# Patient Record
Sex: Female | Born: 1990
Health system: Southern US, Community
[De-identification: ages and names within clinical notes are randomized; demographics above are authoritative.]

## PROBLEM LIST (undated history)

## (undated) DIAGNOSIS — N760 Acute vaginitis: Secondary | ICD-10-CM

## (undated) DIAGNOSIS — Z789 Other specified health status: Secondary | ICD-10-CM

## (undated) DIAGNOSIS — B9689 Other specified bacterial agents as the cause of diseases classified elsewhere: Secondary | ICD-10-CM

## (undated) DIAGNOSIS — N898 Other specified noninflammatory disorders of vagina: Principal | ICD-10-CM

## (undated) HISTORY — PX: NO PAST SURGERIES: SHX2092

## (undated) HISTORY — DX: Other specified bacterial agents as the cause of diseases classified elsewhere: B96.89

## (undated) HISTORY — DX: Acute vaginitis: N76.0

## (undated) HISTORY — DX: Other specified health status: Z78.9

## (undated) HISTORY — DX: Other specified noninflammatory disorders of vagina: N89.8

---
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
## 2009-12-03 ENCOUNTER — Emergency Department (HOSPITAL_COMMUNITY): Admission: EM | Admit: 2009-12-03 | Discharge: 2009-12-03 | Payer: Self-pay | Admitting: Emergency Medicine

## 2011-07-28 ENCOUNTER — Encounter (HOSPITAL_COMMUNITY): Payer: Self-pay | Admitting: Emergency Medicine

## 2011-07-28 ENCOUNTER — Emergency Department (HOSPITAL_COMMUNITY)
Admission: EM | Admit: 2011-07-28 | Discharge: 2011-07-28 | Disposition: A | Discharge status: Home / Self Care | Payer: Self-pay | Attending: Emergency Medicine | Admitting: Emergency Medicine

## 2011-07-28 DIAGNOSIS — Z87891 Personal history of nicotine dependence: Secondary | ICD-10-CM | POA: Insufficient documentation

## 2011-07-28 DIAGNOSIS — B360 Pityriasis versicolor: Secondary | ICD-10-CM | POA: Insufficient documentation

## 2011-07-28 NOTE — ED Notes (Signed)
Pt c/o rash on R leg.

## 2011-07-28 NOTE — Discharge Instructions (Signed)
Tinea Versicolor Tinea versicolor is a common yeast infection of the skin. This condition becomes known when the yeast on our skin starts to overgrow (yeast is a normal inhabitant on our skin). This condition is noticed as white or light brown patches on brown skin, and is more evident in the summer on tanned skin. These areas are slightly scaly if scratched. The light patches from the yeast become evident when the yeast creates "holes in your suntan". This is most often noticed in the summer. The patches are usually located on the chest, back, pubis, neck and body folds. However, it may occur on any area of body. Mild itching and inflammation (redness or soreness) may be present. DIAGNOSIS  The diagnosisof this is made clinically (by looking). Cultures from samples are usually not needed. Examination under the microscope may help. However, yeast is normally found on skin. The diagnosis still remains clinical. Examination under Wood's Ultraviolet Light can determine the extent of the infection. TREATMENT  This common infection is usually only of cosmetic (only a concern to your appearance). It is easily treated with dandruff shampoo used during showers or bathing. Vigorous scrubbing will eliminate the yeast over several days time. The light areas in your skin may remain for weeks or months after the infection is cured unless your skin is exposed to sunlight. The lighter or darker spots caused by the fungus that remain after complete treatment are not a sign of treatment failure; it will take a long time to resolve. Your caregiver may recommend a number of commercial preparations or medication by mouth if home care is not working. Recurrence is common and preventative medication may be necessary. This skin condition is not highly contagious. Special care is not needed to protect close friends and family members. Normal hygiene is usually enough. Follow up is required only if you develop complications (such as a  secondary infection from scratching), if recommended by your caregiver, or if no relief is obtained from the preparations used. Document Released: 05/01/2000 Document Revised: 04/23/2011 Document Reviewed: 06/13/2008 [facility] Patient Information 2012 [place], [place].   Use selsun blue or head and shoulders shampoo as discussed above.

## 2011-07-28 NOTE — ED Provider Notes (Signed)
History     CSN: [id number]  Arrival date & time 07/28/11  1305   First MD Initiated Contact with Patient 07/28/11 1327      Chief Complaint  Patient presents with  . Rash    (Consider location/radiation/quality/duration/timing/severity/associated sxs/prior treatment) Patient is a 21 y.o. female presenting with rash. The history is provided by the patient.  Rash  This is a new problem. Episode onset: First noticed rash about 2 weeks ago. The problem has not changed since onset.The problem is associated with nothing. There has been no fever. Affected Location: Isolated to upper and lower medial legs. The pain is at a severity of 0/10. The patient is experiencing no pain. Pertinent negatives include no blisters, no itching, no pain and no weeping. She has tried nothing for the symptoms. The treatment provided no relief. Risk factors: She denies any change in environment such as new job or home, no new medications.  She is using a  lotion on her legs which is not new or different.    History reviewed. No pertinent past medical history.  History reviewed. No pertinent past surgical history.  History reviewed. No pertinent family history.  History  Substance Use Topics  . Smoking status: Former Smoker    Types: Cigarettes  . Smokeless tobacco: Never Used  . Alcohol Use: No    OB History    Grav Para Term Preterm Abortions TAB SAB Ect Mult Living   1    1 1           Review of Systems  Constitutional: Negative for fever.  HENT: Negative for congestion, sore throat and neck pain.   Eyes: Negative.   Respiratory: Negative for chest tightness and shortness of breath.   Cardiovascular: Negative for chest pain.  Gastrointestinal: Negative for nausea and abdominal pain.  Genitourinary: Negative.   Musculoskeletal: Negative for joint swelling and arthralgias.  Skin: Positive for rash. Negative for itching and wound.  Neurological: Negative for dizziness, weakness,  light-headedness, numbness and headaches.  Hematological: Negative.   Psychiatric/Behavioral: Negative.     Allergies  Review of patient's allergies indicates no known allergies.  Home Medications  No current outpatient prescriptions on file.  BP 178/0  Pulse 82  Temp(Src) 98.6 F (37 C) (Oral)  Resp 18  SpO2 100%  LMP 07/16/2011  Physical Exam  Nursing note and vitals reviewed. Constitutional: She is oriented to person, place, and time. She appears well-developed and well-nourished.  HENT:  Head: Normocephalic and atraumatic.  Eyes: Conjunctivae are normal.  Neck: Normal range of motion.  Cardiovascular: Normal rate, regular rhythm and intact distal pulses.   Pulmonary/Chest: Effort normal and breath sounds normal. She has no wheezes.  Abdominal: She exhibits no distension.  Musculoskeletal: Normal range of motion.  Neurological: She is alert and oriented to person, place, and time.  Skin: Skin is warm and dry. Rash noted. No erythema.       Macular-appearing lacy discoloration on bilateral medial legs.  No excoriations, papules, erythema or induration.  The lacy configuration is lighter than her normal skin tone.  Psychiatric: She has a normal mood and affect.    ED Course  Procedures (including critical care time)  Labs Reviewed - No data to display No results found.   1. Tinea versicolor       MDM  Encouraged a trial of selenium sulfite such as head and shoulders or Selsun Blue shampoo applied to skin twice daily and washed.  Referral to Dr. Margo Aye for  recheck if not improving over the next 2 weeks.       Candis Musa, PA 07/28/11 1610

## 2011-07-28 NOTE — ED Provider Notes (Signed)
Medical screening examination/treatment/procedure(s) were performed by non-physician practitioner and as supervising physician I was immediately available for consultation/collaboration.  Breyon Blass, MD 07/28/11 1811 

## 2011-10-18 ENCOUNTER — Encounter (HOSPITAL_COMMUNITY): Payer: Self-pay | Admitting: *Deleted

## 2011-10-18 ENCOUNTER — Emergency Department (HOSPITAL_COMMUNITY)
Admission: EM | Admit: 2011-10-18 | Discharge: 2011-10-18 | Disposition: A | Discharge status: Home / Self Care | Payer: Self-pay | Attending: Emergency Medicine | Admitting: Emergency Medicine

## 2011-10-18 DIAGNOSIS — N949 Unspecified condition associated with female genital organs and menstrual cycle: Secondary | ICD-10-CM | POA: Insufficient documentation

## 2011-10-18 DIAGNOSIS — A499 Bacterial infection, unspecified: Secondary | ICD-10-CM | POA: Insufficient documentation

## 2011-10-18 DIAGNOSIS — B9689 Other specified bacterial agents as the cause of diseases classified elsewhere: Secondary | ICD-10-CM | POA: Insufficient documentation

## 2011-10-18 DIAGNOSIS — R102 Pelvic and perineal pain: Secondary | ICD-10-CM

## 2011-10-18 DIAGNOSIS — N760 Acute vaginitis: Secondary | ICD-10-CM | POA: Insufficient documentation

## 2011-10-18 LAB — URINALYSIS, ROUTINE W REFLEX MICROSCOPIC
Glucose, UA: NEGATIVE mg/dL
Leukocytes, UA: NEGATIVE
Protein, ur: NEGATIVE mg/dL
pH: 6 (ref 5.0–8.0)

## 2011-10-18 LAB — URINE MICROSCOPIC-ADD ON

## 2011-10-18 LAB — WET PREP, GENITAL
Trich, Wet Prep: NONE SEEN
Yeast Wet Prep HPF POC: NONE SEEN

## 2011-10-18 MED ORDER — OXYCODONE-ACETAMINOPHEN 5-325 MG PO TABS: 1.0000 | ORAL_TABLET | Freq: Four times a day (QID) | ORAL | Status: AC | PRN

## 2011-10-18 MED ORDER — METRONIDAZOLE 500 MG PO TABS: 500.0000 mg | ORAL_TABLET | Freq: Two times a day (BID) | ORAL | Status: AC

## 2011-10-18 NOTE — ED Notes (Signed)
Burning with urination that started today, associated with generalized weakness, vaginal pain.

## 2011-10-18 NOTE — ED Notes (Signed)
Discharge instructions reviewed with pt, questions answered. Pt verbalized understanding.  

## 2011-10-18 NOTE — ED Notes (Signed)
MD at bedside. 

## 2011-10-18 NOTE — Discharge Instructions (Signed)
Bacterial Vaginosis Bacterial vaginosis (BV) is a vaginal infection where the normal balance of bacteria in the vagina is disrupted. The normal balance is then replaced by an overgrowth of certain bacteria. There are several different kinds of bacteria that can cause BV. BV is the most common vaginal infection in women of childbearing age. CAUSES   The cause of BV is not fully understood. BV develops when there is an increase or imbalance of harmful bacteria.   Some activities or behaviors can upset the normal balance of bacteria in the vagina and put women at increased risk including:   Having a new sex partner or multiple sex partners.   Douching.   Using an intrauterine device (IUD) for contraception.   It is not clear what role sexual activity plays in the development of BV. However, women that have never had sexual intercourse are rarely infected with BV.  Women do not get BV from toilet seats, bedding, swimming pools or from touching objects around them.  SYMPTOMS   Grey vaginal discharge.   A fish-like odor with discharge, especially after sexual intercourse.   Itching or burning of the vagina and vulva.   Burning or pain with urination.   Some women have no signs or symptoms at all.  DIAGNOSIS  Your caregiver must examine the vagina for signs of BV. Your caregiver will perform lab tests and look at the sample of vaginal fluid through a microscope. They will look for bacteria and abnormal cells (clue cells), a pH test higher than 4.5, and a positive amine test all associated with BV.  RISKS AND COMPLICATIONS   Pelvic inflammatory disease (PID).   Infections following gynecology surgery.   Developing HIV.   Developing herpes virus.  TREATMENT  Sometimes BV will clear up without treatment. However, all women with symptoms of BV should be treated to avoid complications, especially if gynecology surgery is planned. Female partners generally do not need to be treated. However,  BV may spread between female sex partners so treatment is helpful in preventing a recurrence of BV.   BV may be treated with antibiotics. The antibiotics come in either pill or vaginal cream forms. Either can be used with nonpregnant or pregnant women, but the recommended dosages differ. These antibiotics are not harmful to the baby.   BV can recur after treatment. If this happens, a second round of antibiotics will often be prescribed.   Treatment is important for pregnant women. If not treated, BV can cause a premature delivery, especially for a pregnant woman who had a premature birth in the past. All pregnant women who have symptoms of BV should be checked and treated.   For chronic reoccurrence of BV, treatment with a type of prescribed gel vaginally twice a week is helpful.  HOME CARE INSTRUCTIONS   Finish all medication as directed by your caregiver.   Do not have sex until treatment is completed.   Tell your sexual partner that you have a vaginal infection. They should see their caregiver and be treated if they have problems, such as a mild rash or itching.   Practice safe sex. Use condoms. Only have 1 sex partner.  PREVENTION  Basic prevention steps can help reduce the risk of upsetting the natural balance of bacteria in the vagina and developing BV:  Do not have sexual intercourse (be abstinent).   Do not douche.   Use all of the medicine prescribed for treatment of BV, even if the signs and symptoms go away.     Tell your sex partner if you have BV. That way, they can be treated, if needed, to prevent reoccurrence.  SEEK MEDICAL CARE IF:   Your symptoms are not improving after 3 days of treatment.   You have increased discharge, pain, or fever.  MAKE SURE YOU:   Understand these instructions.   Will watch your condition.   Will get help right away if you are not doing well or get worse.  FOR MORE INFORMATION  Division of STD Prevention (DSTDP), Centers for Disease  Control and Prevention: [employer]Apps.[employer] American Social Health Association (ASHA): www.ashastd.org  Document Released: 05/04/2005 Document Revised: 04/23/2011 Document Reviewed: 10/25/2008 [facility] Patient Information 2012 [place], [place].Disorders of the Vulva It is important to know the outside (external) area of the female genitalia to properly examine yourself. The vulva or labia, sometimes also called the lips around the vagina, covers the pubic bone and surrounds the vagina. The larger or outer labia is called the labia majora. The inner or smaller labia is called the labia minora. The clitoris is at the top of the vulva. It is covered by a small area of tissue called the hood. Below the clitoris is the opening of the tube from the bladder, which is the urethral opening. Just below the urethral opening is the opening of the vagina. This area is called the vestibule. Inside the vestibule are bartholin and skene glands that lubricate the outside of the vagina during sexual intercourse. The perineum is the area that lies between the vagina and anus. You should examine your external genitalia once a month just as you do your self breast examination. SIGNS AND SYMPTOMS TO LOOK FOR DURING YOUR EXAMINATION:  Swelling.   Redness.   Bumps.   Blisters.   Black or white areas.   Itching.   Bleeding.   Burning.  TYPES OF VULVAR PROBLEMS  Infections.   Yeast (fungus) is the most common infection that causes redness, swelling, itching and a thick white vaginal discharge. Women with diabetes, taking medicines that kill germs (antibiotics) or on birth control pills are at risk for yeast infection. This infection is treated with vaginal creams, suppositories and anti-itch cream for the vulva.   Genital warts (condyloma) are caused by the human papilloma virus. They are raised bumps that can itch, bleed, cause discomfort and sometimes appear in groups like cauliflower. This is a sexually transmitted  disease (STD) caused by sexual contact. This can be treated with topical medication, freezing, electrocautery, laser or surgical removal.   Genital herpes is a virus that causes redness, swelling, blisters and ulcers that can cause itching and are very painful. This is also a STD. There are medications to control the symptoms of herpes, but there is no cure. Once you have it, you keep getting it because the virus stays in your body.   Contact dermatitis. Contact dermatitis is an irritation of the vulva that can cause redness, swelling and itching. It can be caused by:   Perfumed toilet paper.   Deodorants.   Talcum powder.   Hygiene sprays.   Tampons.   Soaps and fabric softeners.   Wearing wet underwear and bathing suits too long.   Spermicide.   Condoms.   Diaphragms.   Poison ivy.  Treatment will depend on eliminating the cause and treating the symptoms.  Vulvodynia.   Vulvodynia is "painful vulva." It includes burning, itching, irritation and a feeling of rawness or bruising of the vulva. Treating this problem depends on the cause and diagnosis.  Treatment can take a long time.   Vulvar Dystrophy.   Vulvar Dystrophy is a disorder of the skin of the vulva. The skin can be too thick with hard raised patches or too thin skin showing wrinkles. Vulvar dystrophy can cause redness or white patches, itching and burning sensation. A biopsy may be needed to diagnose the problem. Treatment with creams or ointments depends on the diagnosis.   Vulvar Cancer.   Vulvar cancer is usually a form of squamus skin cancer. Other types of vulvar cancer like melenoma (a dark or black, irregular shaped mole that can bleed easily) and adenocarcinoma (red, itchy, scaly area that looks like eczema) are rare. Treatment is usually surgery to remove the cancerous area, the vulva and the lymph nodes in the groin. This can be done with or without radiation and chemotherapy.  HOME CARE INSTRUCTIONS    Look at your vulva and external genital area every month.   Follow and finish all treatment given to you by your caregiver.   Do not use perfumed or scented soaps, detergents, hygiene spray, talcum powder, tampons, douches or toilet paper.   Remove your tampon every 6 hours. Do not leave tampons in overnight.   Wear loose-fitting clothing.   Wear underwear that is cotton.   Avoid spermicidal creams or foams that irritate you.  SEEK MEDICAL CARE IF:   You notice changes on your vulva such as redness, swelling, itching, color changes, bleeding, small bumps, blisters or any discomfort.   You develop an abnormal vaginal discharge.   You have painful sexual intercourse.   You notice swelling of the lymph nodes in your groin.  Document Released: 10/21/2007 Document Revised: 04/23/2011 Document Reviewed: 08/04/2010  [facility] Patient Information 2012 [place], [place].

## 2011-10-18 NOTE — ED Notes (Signed)
Upon in and out catheterization for urine pt stated the pain was worse when cleaning than during the insertion. Pt complains of a "scratch" on the inside of the vagina which causes burning on urination. Urine collected and sent to lab for analysis.

## 2011-10-18 NOTE — ED Provider Notes (Signed)
History    This chart was scribed for [employer]. Rubin Payor, MD, MD by Smitty Pluck. The patient was seen in room APA03 and the patient's care was started at 3:11PM.   CSN: [id number]  Arrival date & time 10/18/11  1403   First MD Initiated Contact with Patient 10/18/11 1506      Chief Complaint  Patient presents with  . Urinary Tract Infection  . Fatigue    (Consider location/radiation/quality/duration/timing/severity/associated sxs/prior treatment) Patient is a 21 y.o. female presenting with urinary tract infection. The history is provided by the patient.  Urinary Tract Infection Pertinent negatives include no abdominal pain and no shortness of breath.   Sonia Gibson is a 21 y.o. female who presents to the Emergency Department complaining of moderate dysuria onset today. She states that she shaved 3 days ago and since the hair has started growing back she has been experiencing current symptoms. Pt reports that her menstrual cycle is painful. Denies vaginal discharge. She reports that she has vaginal itching. She denies nausea, vomiting, fever and diarrhea. Symptoms have been constant.   History reviewed. No pertinent past medical history.  History reviewed. No pertinent past surgical history.  No family history on file.  History  Substance Use Topics  . Smoking status: Former Smoker    Types: Cigarettes  . Smokeless tobacco: Never Used  . Alcohol Use: No    OB History    Grav Para Term Preterm Abortions TAB SAB Ect Mult Living   1    1 1           Review of Systems  Constitutional: Negative for fever and chills.  Respiratory: Negative for cough and shortness of breath.   Gastrointestinal: Negative for abdominal pain and blood in stool.  Genitourinary: Positive for dysuria and vaginal bleeding. Negative for decreased urine volume and vaginal discharge.  Musculoskeletal: Negative for back pain.  Psychiatric/Behavioral: Negative for agitation.   10 Systems reviewed  and all are negative for acute change except as noted in the HPI.   Allergies  Review of patient's allergies indicates no known allergies.  Home Medications   Current Outpatient Rx  Name Route Sig Dispense Refill  . METRONIDAZOLE 500 MG PO TABS Oral Take 1 tablet (500 mg total) by mouth 2 (two) times daily. 14 tablet 0  . OXYCODONE-ACETAMINOPHEN 5-325 MG PO TABS Oral Take 1-2 tablets by mouth every 6 (six) hours as needed for pain. 10 tablet 0    BP 120/77  Pulse 83  Temp 98.4 F (36.9 C)  Resp 16  SpO2 100%  LMP 10/18/2011  Physical Exam  Nursing note and vitals reviewed. Constitutional: She is oriented to person, place, and time. She appears well-developed and well-nourished. No distress.  HENT:  Head: Normocephalic and atraumatic.  Eyes: EOM are normal. Pupils are equal, round, and reactive to light.  Neck: Normal range of motion. Neck supple. No tracheal deviation present.  Cardiovascular: Normal rate, regular rhythm and normal heart sounds.   Pulmonary/Chest: Effort normal and breath sounds normal. No respiratory distress.  Abdominal: Soft. She exhibits no distension and no mass. There is no tenderness. There is no rebound and no guarding.  Genitourinary:       Patient has vaginal bleeding consistent with her menses. She has mild erythema of her vulva.no Fluctuance. Minimal swelling.no induration. No ulcers. No tearing.  Musculoskeletal: Normal range of motion.  Neurological: She is alert and oriented to person, place, and time.  Skin: Skin is warm and dry.  Psychiatric: She has a normal mood and affect. Her behavior is normal.    ED Course  Procedures (including critical care time) DIAGNOSTIC STUDIES: Oxygen Saturation is 100% on room air, normal by my interpretation.    COORDINATION OF CARE: 3:15PM EDP discusses pt ED treatment with pt.   Labs Reviewed  URINALYSIS, ROUTINE W REFLEX MICROSCOPIC - Abnormal; Notable for the following:    Hgb urine dipstick LARGE  (*)    All other components within normal limits  URINE MICROSCOPIC-ADD ON - Abnormal; Notable for the following:    Squamous Epithelial / LPF FEW (*)    All other components within normal limits  WET PREP, GENITAL - Abnormal; Notable for the following:    Clue Cells Wet Prep HPF POC FEW (*)    All other components within normal limits  PREGNANCY, URINE  GC/CHLAMYDIA PROBE AMP, GENITAL   No results found.   1. Vulvar pain   2. BV (bacterial vaginosis)       MDM  Patient with pain of her vulva. She is very tender but no ulcers or clear signs of infection. She had mild bacterial vaginosis on wet prep. She be treated pain medicines and Flagyl he'll followup with gynecology.herpes was considered, but there were no current lesions.    I personally performed the services described in this documentation, which was scribed in my presence. The recorded information has been reviewed and considered. Juliet Rude. Rubin Payor, MD   Juliet Rude. Rubin Payor, MD 10/18/11 [phone]

## 2011-10-19 LAB — GC/CHLAMYDIA PROBE AMP, GENITAL
Chlamydia, DNA Probe: NEGATIVE
GC Probe Amp, Genital: NEGATIVE

## 2013-03-28 ENCOUNTER — Encounter (HOSPITAL_COMMUNITY): Payer: Self-pay | Admitting: Emergency Medicine

## 2013-03-28 DIAGNOSIS — Z791 Long term (current) use of non-steroidal anti-inflammatories (NSAID): Secondary | ICD-10-CM | POA: Insufficient documentation

## 2013-03-28 DIAGNOSIS — Z87891 Personal history of nicotine dependence: Secondary | ICD-10-CM | POA: Insufficient documentation

## 2013-03-28 DIAGNOSIS — Z79899 Other long term (current) drug therapy: Secondary | ICD-10-CM | POA: Insufficient documentation

## 2013-03-28 DIAGNOSIS — L02219 Cutaneous abscess of trunk, unspecified: Secondary | ICD-10-CM | POA: Insufficient documentation

## 2013-03-28 NOTE — ED Notes (Signed)
Patient complaining of knot/abscess to groin. States noticed knot approximately a week ago, and has not improved.

## 2013-03-29 ENCOUNTER — Emergency Department (HOSPITAL_COMMUNITY)
Admission: EM | Admit: 2013-03-29 | Discharge: 2013-03-29 | Disposition: A | Discharge status: Home / Self Care | Payer: Medicaid Other | Attending: Emergency Medicine | Admitting: Emergency Medicine

## 2013-03-29 DIAGNOSIS — L02214 Cutaneous abscess of groin: Secondary | ICD-10-CM

## 2013-03-29 MED ORDER — SULFAMETHOXAZOLE-TRIMETHOPRIM 800-160 MG PO TABS: 1.0000 | ORAL_TABLET | Freq: Two times a day (BID) | ORAL | Status: DC

## 2013-03-29 MED ORDER — SULFAMETHOXAZOLE-TMP DS 800-160 MG PO TABS
1.0000 | ORAL_TABLET | Freq: Once | ORAL | Status: AC
  Administered 2013-03-29: 1 via ORAL
  Filled 2013-03-29: qty 1

## 2013-03-29 MED ORDER — NAPROXEN 500 MG PO TABS: 500.0000 mg | ORAL_TABLET | Freq: Two times a day (BID) | ORAL | Status: DC

## 2013-03-29 NOTE — ED Provider Notes (Signed)
CSN: [id number]     Arrival date & time 03/28/13  2225 History   First MD Initiated Contact with Patient 03/29/13 0034     Chief Complaint  Patient presents with  . Abscess   (Consider location/radiation/quality/duration/timing/severity/associated sxs/prior Treatment) HPI Comments: 22 year old female, no known medical history who presents with a complaint of right groin pain and swelling. This started several days ago, is gradually worsening, it is associated with a small area of a bump in her right labial fold. She denies fevers, history of diabetes or HIV. Symptoms are persistent  Patient is a 22 y.o. female presenting with abscess. The history is provided by the patient.  Abscess   History reviewed. No pertinent past medical history. History reviewed. No pertinent past surgical history. History reviewed. No pertinent family history. History  Substance Use Topics  . Smoking status: Former Smoker    Types: Cigarettes  . Smokeless tobacco: Never Used  . Alcohol Use: No   OB History   Grav Para Term Preterm Abortions TAB SAB Ect Mult Living   1    1 1          Review of Systems  All other systems reviewed and are negative.    Allergies  Review of patient's allergies indicates no known allergies.  Home Medications   Current Outpatient Rx  Name  Route  Sig  Dispense  Refill  . Multiple Vitamin (MULTIVITAMIN WITH MINERALS) TABS tablet   Oral   Take 1 tablet by mouth daily.         . vitamin E 100 UNIT capsule   Oral   Take by mouth daily.         . naproxen (NAPROSYN) 500 MG tablet   Oral   Take 1 tablet (500 mg total) by mouth 2 (two) times daily with a meal.   30 tablet   0   . sulfamethoxazole-trimethoprim (SEPTRA DS) 800-160 MG per tablet   Oral   Take 1 tablet by mouth every 12 (twelve) hours.   20 tablet   0    BP 131/80  Pulse 75  Temp(Src) 98.1 F (36.7 C) (Oral)  Resp 20  Ht 5\' 4"  (1.626 m)  Wt 109 lb (49.442 kg)  BMI 18.70 kg/m2  SpO2  100%  LMP 03/20/2013 Physical Exam  Nursing note and vitals reviewed. Constitutional: She appears well-developed and well-nourished. No distress.  HENT:  Head: Normocephalic and atraumatic.  Mouth/Throat: Oropharynx is clear and moist. No oropharyngeal exudate.  Eyes: Conjunctivae and EOM are normal. Pupils are equal, round, and reactive to light. Right eye exhibits no discharge. Left eye exhibits no discharge. No scleral icterus.  Neck: Normal range of motion. Neck supple. No JVD present. No thyromegaly present.  Cardiovascular: Normal rate, regular rhythm, normal heart sounds and intact distal pulses.  Exam reveals no gallop and no friction rub.   No murmur heard. Pulmonary/Chest: Effort normal and breath sounds normal. No respiratory distress. She has no wheezes. She has no rales.  Abdominal: Soft. Bowel sounds are normal. She exhibits no distension and no mass. There is no tenderness.  Genitourinary:  Chaperone present for exam, 0.5 cm area of swelling in the right labia, no fluctuants, minimal tenderness, no redness, no lymphadenopathy in the right inguinal region  Musculoskeletal: Normal range of motion. She exhibits no edema and no tenderness.  Lymphadenopathy:    She has no cervical adenopathy.  Neurological: She is alert. Coordination normal.  Skin: Skin is warm and dry. No rash  noted. No erythema.  Psychiatric: She has a normal mood and affect. Her behavior is normal.    ED Course  Procedures (including critical care time) Labs Review Labs Reviewed - No data to display Imaging Review No results found.  EKG Interpretation   None       MDM   1. Abscess of groin    Well appearing, no fever, likely early abscess that is too small for incision and drainage, medications and followup.   Meds given in ED:  Medications  sulfamethoxazole-trimethoprim (BACTRIM DS) 800-160 MG per tablet 1 tablet (not administered)    New Prescriptions   NAPROXEN (NAPROSYN) 500 MG  TABLET    Take 1 tablet (500 mg total) by mouth 2 (two) times daily with a meal.   SULFAMETHOXAZOLE-TRIMETHOPRIM (SEPTRA DS) 800-160 MG PER TABLET    Take 1 tablet by mouth every 12 (twelve) hours.        Vida Roller, MD 03/29/13 [phone]

## 2013-05-18 NOTE — L&D Delivery Note (Signed)
Delivery Note At 11:09 AM a viable female was delivered [facility]  I arrived immediately after birth and delivered placenta and inspected perineum.  APGAR: 9, 9; weight 6 lb 14.9 oz (3145 g).   Placenta status: Intact, Spontaneous.  Cord: 3 vessels with the following complications: None.    Anesthesia: None  Episiotomy: None Lacerations: small hemostatic periurethral, no repair Suture Repair: n/a Est. Blood Loss (mL): 350ml  [facility].  [facility] for skin-to-skin/bonding w/ mom via [facility]. Plans to bottlefeed, depo or COCs for contraception, outpatient circumcision  Sonia DuncansBooker, Sonia Gibson 03/10/2014, 1:13 PM

## 2013-09-18 ENCOUNTER — Other Ambulatory Visit: Payer: Self-pay | Admitting: Obstetrics and Gynecology

## 2013-09-18 DIAGNOSIS — O3680X0 Pregnancy with inconclusive fetal viability, not applicable or unspecified: Secondary | ICD-10-CM

## 2013-09-19 ENCOUNTER — Encounter: Payer: Self-pay | Admitting: *Deleted

## 2013-09-20 ENCOUNTER — Ambulatory Visit (INDEPENDENT_AMBULATORY_CARE_PROVIDER_SITE_OTHER): Payer: No Typology Code available for payment source

## 2013-09-20 ENCOUNTER — Other Ambulatory Visit: Payer: Self-pay | Admitting: Obstetrics and Gynecology

## 2013-09-20 DIAGNOSIS — O0930 Supervision of pregnancy with insufficient antenatal care, unspecified trimester: Secondary | ICD-10-CM

## 2013-09-20 DIAGNOSIS — O3680X0 Pregnancy with inconclusive fetal viability, not applicable or unspecified: Secondary | ICD-10-CM

## 2013-09-20 NOTE — Progress Notes (Signed)
U/S-single active fetus, meas c/w 15+2wks EDD-03/12/2014, cx appears closed (3.1cm), bilateral adnexa appears wnl, FHR-157 bpm, posterior Gr 0 placenta, complete anatomy screen in ~4weeks, ?female fetus

## 2013-09-21 ENCOUNTER — Telehealth: Payer: Self-pay | Admitting: *Deleted

## 2013-09-21 NOTE — Telephone Encounter (Signed)
Pt needed a verification of pregnancy, I spoke with the pt and she stated that she would come by our office tomorrow and pick it up.

## 2013-10-04 ENCOUNTER — Encounter: Payer: Self-pay | Admitting: Advanced Practice Midwife

## 2013-10-04 ENCOUNTER — Ambulatory Visit (INDEPENDENT_AMBULATORY_CARE_PROVIDER_SITE_OTHER): Payer: No Typology Code available for payment source | Admitting: Advanced Practice Midwife

## 2013-10-04 VITALS — BP 118/64 | Wt 107.0 lb

## 2013-10-04 DIAGNOSIS — Z1389 Encounter for screening for other disorder: Secondary | ICD-10-CM

## 2013-10-04 DIAGNOSIS — Z331 Pregnant state, incidental: Secondary | ICD-10-CM

## 2013-10-04 DIAGNOSIS — Z3480 Encounter for supervision of other normal pregnancy, unspecified trimester: Secondary | ICD-10-CM | POA: Insufficient documentation

## 2013-10-04 DIAGNOSIS — Z3490 Encounter for supervision of normal pregnancy, unspecified, unspecified trimester: Secondary | ICD-10-CM

## 2013-10-04 DIAGNOSIS — O360990 Maternal care for other rhesus isoimmunization, unspecified trimester, not applicable or unspecified: Secondary | ICD-10-CM

## 2013-10-04 DIAGNOSIS — O09299 Supervision of pregnancy with other poor reproductive or obstetric history, unspecified trimester: Secondary | ICD-10-CM

## 2013-10-04 DIAGNOSIS — Z3400 Encounter for supervision of normal first pregnancy, unspecified trimester: Secondary | ICD-10-CM

## 2013-10-04 LAB — CBC
HEMATOCRIT: 35.3 % — AB (ref 36.0–46.0)
Hemoglobin: 12.2 g/dL (ref 12.0–15.0)
MCH: 31.3 pg (ref 26.0–34.0)
MCHC: 34.6 g/dL (ref 30.0–36.0)
MCV: 90.5 fL (ref 78.0–100.0)
PLATELETS: 208 10*3/uL (ref 150–400)
RBC: 3.9 MIL/uL (ref 3.87–5.11)
RDW: 14.4 % (ref 11.5–15.5)
WBC: 6.5 10*3/uL (ref 4.0–10.5)

## 2013-10-04 MED ORDER — METRONIDAZOLE 500 MG PO TABS: 500.0000 mg | ORAL_TABLET | Freq: Two times a day (BID) | ORAL | Status: DC

## 2013-10-04 NOTE — Progress Notes (Signed)
  Subjective:    Sonia Gibson is a G2P0010 7058w2d being seen today for her first obstetrical visit.  Her obstetrical history is significant for TAB (don't mention).  Pregnancy history fully reviewed.  Patient reports she was dx with BV a few weeks ago, but PCP wanted to wait to treat d/t unknow GA.  Will treat today. Still having some nausea.  Diclegis samples.  Filed Vitals:   10/04/13 1145  BP: 118/64  Weight: 107 lb (48.535 kg)    HISTORY: OB History  Gravida Para Term Preterm AB SAB TAB Ectopic Multiple Living  2    1  1        # Outcome Date GA Lbr Len/2nd Weight Sex Delivery Anes PTL Lv  2 CUR           1 TAB              No past medical history on file. No past surgical history on file. No family history on file.   Exam                                           Skin: normal coloration and turgor, no rashes    Neurologic: oriented, normal, normal mood   Extremities: normal strength, tone, and muscle mass   HEENT PERRLA   Mouth/Teeth mucous membranes moist, pharynx normal without lesions   Neck supple and no masses   Cardiovascular: regular rate and rhythm   Respiratory:  appears well, vitals normal, no respiratory distress, acyanotic, normal RR   Abdomen: soft, non-tender; bowel sounds normal; no masses,  no organomegaly          Assessment:    Pregnancy: G2P0010 Patient Active Problem List   Diagnosis Date Noted  . Pregnant 10/04/2013        Plan:     Initial labs drawn. Continue prenatal vitamins  Meds ordered this encounter  Medications  . metroNIDAZOLE (FLAGYL) 500 MG tablet    Sig: Take 1 tablet (500 mg total) by mouth 2 (two) times daily.    Dispense:  14 tablet    Refill:  0    Order Specific Question:  Supervising Provider    Answer:  Lazaro ArmsEURE, LUTHER H [[id number]]    Problem list reviewed and updated  Reviewed n/v relief measures and warning s/s to report; diclegis samples given Reviewed recommended weight gain based on  pre-gravid BMI  Encouraged well-balanced diet; may use Boost, etc. Genetic Screening discussed Quad Screen: requested.  Ultrasound discussed; fetal survey: requested.  Follow up in 2 weeks for anatomy scan.  Jacklyn ShellFrances Gibson 10/04/2013

## 2013-10-04 NOTE — Patient Instructions (Signed)
Second Trimester of Pregnancy The second trimester is from week 13 through week 28, months 4 through 6. The second trimester is often a time when you feel your best. Your body has also adjusted to being pregnant, and you begin to feel better physically. Usually, morning sickness has lessened or quit completely, you may have more energy, and you may have an increase in appetite. The second trimester is also a time when the fetus is growing rapidly. At the end of the sixth month, the fetus is about 9 inches long and weighs about 1 pounds. You will likely begin to feel the baby move (quickening) between 18 and 20 weeks of the pregnancy. BODY CHANGES Your body goes through many changes during pregnancy. The changes vary from woman to woman.   Your weight will continue to increase. You will notice your lower abdomen bulging out.  You may begin to get stretch marks on your hips, abdomen, and breasts.  You may develop headaches that can be relieved by medicines approved by your caregiver.  You may urinate more often because the fetus is pressing on your bladder.  You may develop or continue to have heartburn as a result of your pregnancy.  You may develop constipation because certain hormones are causing the muscles that push waste through your intestines to slow down.  You may develop hemorrhoids or swollen, bulging veins (varicose veins).  You may have back pain because of the weight gain and pregnancy hormones relaxing your joints between the bones in your pelvis and as a result of a shift in weight and the muscles that support your balance.  Your breasts will continue to grow and be tender.  Your gums may bleed and may be sensitive to brushing and flossing.  Dark spots or blotches (chloasma, mask of pregnancy) may develop on your face. This will likely fade after the baby is born.  A dark line from your belly button to the pubic area (linea nigra) may appear. This will likely fade after the  baby is born. WHAT TO EXPECT AT YOUR PRENATAL VISITS During a routine prenatal visit:  You will be weighed to make sure you and the fetus are growing normally.  Your blood pressure will be taken.  Your abdomen will be measured to track your baby's growth.  The fetal heartbeat will be listened to.  Any test results from the previous visit will be discussed. Your caregiver may ask you:  How you are feeling.  If you are feeling the baby move.  If you have had any abnormal symptoms, such as leaking fluid, bleeding, severe headaches, or abdominal cramping.  If you have any questions. Other tests that may be performed during your second trimester include:  Blood tests that check for:  Low iron levels (anemia).  Gestational diabetes (between 24 and 28 weeks).  Rh antibodies.  Urine tests to check for infections, diabetes, or protein in the urine.  An ultrasound to confirm the proper growth and development of the baby.  An amniocentesis to check for possible genetic problems.  Fetal screens for spina bifida and Down syndrome. HOME CARE INSTRUCTIONS   Avoid all smoking, herbs, alcohol, and unprescribed drugs. These chemicals affect the formation and growth of the baby.  Follow your caregiver's instructions regarding medicine use. There are medicines that are either safe or unsafe to take during pregnancy.  Exercise only as directed by your caregiver. Experiencing uterine cramps is a good sign to stop exercising.  Continue to eat regular,   healthy meals.  Wear a good support bra for breast tenderness.  Do not use hot tubs, steam rooms, or saunas.  Wear your seat belt at all times when driving.  Avoid raw meat, uncooked cheese, cat litter boxes, and soil used by cats. These carry germs that can cause birth defects in the baby.  Take your prenatal vitamins.  Try taking a stool softener (if your caregiver approves) if you develop constipation. Eat more high-fiber foods,  such as fresh vegetables or fruit and whole grains. Drink plenty of fluids to keep your urine clear or pale yellow.  Take warm sitz baths to soothe any pain or discomfort caused by hemorrhoids. Use hemorrhoid cream if your caregiver approves.  If you develop varicose veins, wear support hose. Elevate your feet for 15 minutes, 3 4 times a day. Limit salt in your diet.  Avoid heavy lifting, wear low heel shoes, and practice good posture.  Rest with your legs elevated if you have leg cramps or low back pain.  Visit your dentist if you have not gone yet during your pregnancy. Use a soft toothbrush to brush your teeth and be gentle when you floss.  A sexual relationship may be continued unless your caregiver directs you otherwise.  Continue to go to all your prenatal visits as directed by your caregiver. SEEK MEDICAL CARE IF:   You have dizziness.  You have mild pelvic cramps, pelvic pressure, or nagging pain in the abdominal area.  You have persistent nausea, vomiting, or diarrhea.  You have a bad smelling vaginal discharge.  You have pain with urination. SEEK IMMEDIATE MEDICAL CARE IF:   You have a fever.  You are leaking fluid from your vagina.  You have spotting or bleeding from your vagina.  You have severe abdominal cramping or pain.  You have rapid weight gain or loss.  You have shortness of breath with chest pain.  You notice sudden or extreme swelling of your face, hands, ankles, feet, or legs.  You have not felt your baby move in over an hour.  You have severe headaches that do not go away with medicine.  You have vision changes. Document Released: 04/28/2001 Document Revised: 01/04/2013 Document Reviewed: 07/05/2012 [employer] Patient Information 2014 [employer].  

## 2013-10-05 LAB — URINALYSIS, ROUTINE W REFLEX MICROSCOPIC
Bilirubin Urine: NEGATIVE
Glucose, UA: NEGATIVE mg/dL
Hgb urine dipstick: NEGATIVE
Ketones, ur: NEGATIVE mg/dL
Leukocytes, UA: NEGATIVE
Nitrite: NEGATIVE
PH: 7 (ref 5.0–8.0)
Protein, ur: NEGATIVE mg/dL
Specific Gravity, Urine: 1.005 (ref 1.005–1.030)
UROBILINOGEN UA: 0.2 mg/dL (ref 0.0–1.0)

## 2013-10-05 LAB — AFP, QUAD SCREEN
AFP: 52.3 [IU]/mL
Age Alone: 1:1080 {titer}
Curr Gest Age: 17.2 wks.days
Down Syndrome Scr Risk Est: 1:5150 {titer}
HCG, Total: 32178 m[IU]/mL
INH: 213.3 pg/mL
Interpretation-AFP: NEGATIVE
MoM for AFP: 1.12
MoM for INH: 1.07
MoM for hCG: 1.15
Open Spina bifida: NEGATIVE
Osb Risk: 1:17800 {titer}
Tri 18 Scr Risk Est: NEGATIVE
Trisomy 18 (Edward) Syndrome Interp.: 1:38500 {titer}
uE3 Mom: 0.71
uE3 Value: 0.5 ng/mL

## 2013-10-05 LAB — DRUG SCREEN, URINE, NO CONFIRMATION
Amphetamine Screen, Ur: NEGATIVE
BENZODIAZEPINES.: NEGATIVE
Barbiturate Quant, Ur: NEGATIVE
Cocaine Metabolites: NEGATIVE
Creatinine,U: 35.1 mg/dL
MARIJUANA METABOLITE: NEGATIVE
METHADONE: NEGATIVE
OPIATE SCREEN, URINE: NEGATIVE
Phencyclidine (PCP): NEGATIVE
Propoxyphene: NEGATIVE

## 2013-10-05 LAB — SICKLE CELL SCREEN: Sickle Cell Screen: NEGATIVE

## 2013-10-05 LAB — ABO AND RH: RH TYPE: NEGATIVE

## 2013-10-05 LAB — RPR

## 2013-10-05 LAB — HIV ANTIBODY (ROUTINE TESTING W REFLEX): HIV 1&2 Ab, 4th Generation: NONREACTIVE

## 2013-10-05 LAB — HEPATITIS B SURFACE ANTIGEN: Hepatitis B Surface Ag: NEGATIVE

## 2013-10-05 LAB — ANTIBODY SCREEN: ANTIBODY SCREEN: NEGATIVE

## 2013-10-05 LAB — RUBELLA SCREEN: Rubella: 2.55 Index — ABNORMAL HIGH (ref <0.90)

## 2013-10-05 LAB — OXYCODONE SCREEN, UA, RFLX CONFIRM: Oxycodone Screen, Ur: NEGATIVE ng/mL

## 2013-10-05 LAB — VARICELLA ZOSTER ANTIBODY, IGG: VARICELLA IGG: 1551 {index} — AB (ref <135.00)

## 2013-10-06 LAB — URINE CULTURE
COLONY COUNT: NO GROWTH
Organism ID, Bacteria: NO GROWTH

## 2013-10-18 ENCOUNTER — Ambulatory Visit (INDEPENDENT_AMBULATORY_CARE_PROVIDER_SITE_OTHER): Payer: No Typology Code available for payment source

## 2013-10-18 ENCOUNTER — Encounter: Payer: Self-pay | Admitting: Advanced Practice Midwife

## 2013-10-18 ENCOUNTER — Ambulatory Visit (INDEPENDENT_AMBULATORY_CARE_PROVIDER_SITE_OTHER): Payer: No Typology Code available for payment source | Admitting: Advanced Practice Midwife

## 2013-10-18 ENCOUNTER — Other Ambulatory Visit: Payer: Self-pay | Admitting: Advanced Practice Midwife

## 2013-10-18 VITALS — BP 100/64 | Wt 111.0 lb

## 2013-10-18 DIAGNOSIS — Z1389 Encounter for screening for other disorder: Secondary | ICD-10-CM

## 2013-10-18 DIAGNOSIS — Z331 Pregnant state, incidental: Secondary | ICD-10-CM

## 2013-10-18 DIAGNOSIS — R350 Frequency of micturition: Secondary | ICD-10-CM

## 2013-10-18 DIAGNOSIS — O09299 Supervision of pregnancy with other poor reproductive or obstetric history, unspecified trimester: Secondary | ICD-10-CM

## 2013-10-18 DIAGNOSIS — Z3400 Encounter for supervision of normal first pregnancy, unspecified trimester: Secondary | ICD-10-CM

## 2013-10-18 LAB — POCT URINALYSIS DIPSTICK
Glucose, UA: NEGATIVE
KETONES UA: NEGATIVE
NITRITE UA: NEGATIVE
RBC UA: NEGATIVE

## 2013-10-18 NOTE — Progress Notes (Signed)
U/S(19+2wks)-active fetus, meas c/w dates, fluid wnl, posterior Gr 0 placenta, cx appears closed (3.1cm), FHR-155 bpm, no major abnl noted, female fetus, bilateral adnexa appears wnl

## 2013-10-18 NOTE — Progress Notes (Signed)
Anatomy scan normal.  C/O white d/c without odor, itch or irritation. SSE: normal appearing d/c.  C/O urinary frequency. Will culture urine.  F/U 4 weeks for Low-risk ob appt

## 2013-10-19 ENCOUNTER — Telehealth: Payer: Self-pay

## 2013-10-19 DIAGNOSIS — Z3490 Encounter for supervision of normal pregnancy, unspecified, unspecified trimester: Secondary | ICD-10-CM

## 2013-10-19 NOTE — Telephone Encounter (Signed)
PT states that she sits for 10 hours an day at her job and needs a note that states that she needs bathroom breaks. Pt states that she needs the letter to state how far along she is and that she needs more bathroom breaks than just 3 in her shift.

## 2013-10-20 LAB — URINE CULTURE
COLONY COUNT: NO GROWTH
Organism ID, Bacteria: NO GROWTH

## 2013-10-20 NOTE — Telephone Encounter (Signed)
Spoke with JAG and she advised that the note should read that the pt is 19w 4d pregnant and my need more than 3 bathroom breaks in a 10 hour shift, due to the pregnancy and may need more frequent bathroom breaks as she gets further along in the pregnancy.   Pt aware to come by and pick up note for work.

## 2013-10-27 ENCOUNTER — Telehealth: Payer: Self-pay | Admitting: *Deleted

## 2013-10-27 NOTE — Telephone Encounter (Signed)
Left message x 1. JSY 

## 2013-10-30 MED ORDER — DOXYLAMINE-PYRIDOXINE 10-10 MG PO TBEC: 10.0000 mg | DELAYED_RELEASE_TABLET | ORAL | Status: DC

## 2013-10-30 NOTE — Telephone Encounter (Signed)
Spoke with pt. Pt is having issues with nausea. Has tried Diclegis before and it helped. Can you order more Diclegis? Thanks!!! [employer]JSY

## 2013-10-31 ENCOUNTER — Telehealth: Payer: Self-pay | Admitting: Obstetrics and Gynecology

## 2013-10-31 NOTE — Telephone Encounter (Signed)
Pt aware Diclegis was sent to pharmacy and can come by and get coupon card. JSY

## 2013-10-31 NOTE — Telephone Encounter (Signed)
Spoke with pt she now has pregnancy medicaid and needs prior auth for the Diclegis.  Form filled out and faxed, pt aware.

## 2013-11-15 ENCOUNTER — Encounter: Payer: Self-pay | Admitting: Women's Health

## 2013-11-15 ENCOUNTER — Ambulatory Visit (INDEPENDENT_AMBULATORY_CARE_PROVIDER_SITE_OTHER): Payer: No Typology Code available for payment source | Admitting: Women's Health

## 2013-11-15 VITALS — BP 100/60 | Wt 115.0 lb

## 2013-11-15 DIAGNOSIS — Z6791 Unspecified blood type, Rh negative: Secondary | ICD-10-CM | POA: Insufficient documentation

## 2013-11-15 DIAGNOSIS — O26899 Other specified pregnancy related conditions, unspecified trimester: Secondary | ICD-10-CM

## 2013-11-15 DIAGNOSIS — Z3480 Encounter for supervision of other normal pregnancy, unspecified trimester: Secondary | ICD-10-CM

## 2013-11-15 DIAGNOSIS — Z1389 Encounter for screening for other disorder: Secondary | ICD-10-CM

## 2013-11-15 DIAGNOSIS — O360121 Maternal care for anti-D [Rh] antibodies, second trimester, fetus 1: Secondary | ICD-10-CM

## 2013-11-15 DIAGNOSIS — Z3482 Encounter for supervision of other normal pregnancy, second trimester: Secondary | ICD-10-CM

## 2013-11-15 DIAGNOSIS — Z331 Pregnant state, incidental: Secondary | ICD-10-CM

## 2013-11-15 LAB — POCT URINALYSIS DIPSTICK
Blood, UA: NEGATIVE
Glucose, UA: NEGATIVE
Ketones, UA: NEGATIVE
Leukocytes, UA: NEGATIVE
Nitrite, UA: NEGATIVE
PROTEIN UA: NEGATIVE

## 2013-11-15 NOTE — Progress Notes (Signed)
Low-risk OB appointment G2P0010 1313w2d Estimated Date of Delivery: 03/12/14 BP 100/60  Wt 115 lb (52.164 kg)  LMP 03/20/2013  BP, weight, and urine reviewed.  Refer to obstetrical flow sheet for FH & FHR.  Reports good fm.  Denies regular uc's, lof, vb, or uti s/s. Had bm yesterday, brown, but had small white balls around it, has had bm today and was normal. Maybe something she ate, or mucous.  Reviewed ptl s/s, fm. Plan:  Continue routine obstetrical care  F/U in 4wks for OB appointment and pn2

## 2013-11-15 NOTE — Progress Notes (Signed)
Pt states that she had a white BM yesterday and is concerned about that, has never happened before.

## 2013-11-15 NOTE — Patient Instructions (Signed)
You will have your sugar test next visit.  Please do not eat or drink anything after midnight the night before you come, not even water.  You will be here for at least two hours.     Second Trimester of Pregnancy The second trimester is from week 13 through week 28, months 4 through 6. The second trimester is often a time when you feel your best. Your body has also adjusted to being pregnant, and you begin to feel better physically. Usually, morning sickness has lessened or quit completely, you may have more energy, and you may have an increase in appetite. The second trimester is also a time when the fetus is growing rapidly. At the end of the sixth month, the fetus is about 9 inches long and weighs about 1 pounds. You will likely begin to feel the baby move (quickening) between 18 and 20 weeks of the pregnancy. BODY CHANGES Your body goes through many changes during pregnancy. The changes vary from woman to woman.   Your weight will continue to increase. You will notice your lower abdomen bulging out.  You may begin to get stretch marks on your hips, abdomen, and breasts.  You may develop headaches that can be relieved by medicines approved by your health care provider.  You may urinate more often because the fetus is pressing on your bladder.  You may develop or continue to have heartburn as a result of your pregnancy.  You may develop constipation because certain hormones are causing the muscles that push waste through your intestines to slow down.  You may develop hemorrhoids or swollen, bulging veins (varicose veins).  You may have back pain because of the weight gain and pregnancy hormones relaxing your joints between the bones in your pelvis and as a result of a shift in weight and the muscles that support your balance.  Your breasts will continue to grow and be tender.  Your gums may bleed and may be sensitive to brushing and flossing.  Dark spots or blotches (chloasma, mask of  pregnancy) may develop on your face. This will likely fade after the baby is born.  A dark line from your belly button to the pubic area (linea nigra) may appear. This will likely fade after the baby is born.  You may have changes in your hair. These can include thickening of your hair, rapid growth, and changes in texture. Some women also have hair loss during or after pregnancy, or hair that feels dry or thin. Your hair will most likely return to normal after your baby is born. WHAT TO EXPECT AT YOUR PRENATAL VISITS During a routine prenatal visit:  You will be weighed to make sure you and the fetus are growing normally.  Your blood pressure will be taken.  Your abdomen will be measured to track your baby's growth.  The fetal heartbeat will be listened to.  Any test results from the previous visit will be discussed. Your health care provider may ask you:  How you are feeling.  If you are feeling the baby move.  If you have had any abnormal symptoms, such as leaking fluid, bleeding, severe headaches, or abdominal cramping.  If you have any questions. Other tests that may be performed during your second trimester include:  Blood tests that check for:  Low iron levels (anemia).  Gestational diabetes (between 24 and 28 weeks).  Rh antibodies.  Urine tests to check for infections, diabetes, or protein in the urine.  An ultrasound   to confirm the proper growth and development of the baby.  An amniocentesis to check for possible genetic problems.  Fetal screens for spina bifida and Down syndrome. HOME CARE INSTRUCTIONS   Avoid all smoking, herbs, alcohol, and unprescribed drugs. These chemicals affect the formation and growth of the baby.  Follow your health care provider's instructions regarding medicine use. There are medicines that are either safe or unsafe to take during pregnancy.  Exercise only as directed by your health care provider. Experiencing uterine cramps is a  good sign to stop exercising.  Continue to eat regular, healthy meals.  Wear a good support bra for breast tenderness.  Do not use hot tubs, steam rooms, or saunas.  Wear your seat belt at all times when driving.  Avoid raw meat, uncooked cheese, cat litter boxes, and soil used by cats. These carry germs that can cause birth defects in the baby.  Take your prenatal vitamins.  Try taking a stool softener (if your health care provider approves) if you develop constipation. Eat more high-fiber foods, such as fresh vegetables or fruit and whole grains. Drink plenty of fluids to keep your urine clear or pale yellow.  Take warm sitz baths to soothe any pain or discomfort caused by hemorrhoids. Use hemorrhoid cream if your health care provider approves.  If you develop varicose veins, wear support hose. Elevate your feet for 15 minutes, 3-4 times a day. Limit salt in your diet.  Avoid heavy lifting, wear low heel shoes, and practice good posture.  Rest with your legs elevated if you have leg cramps or low back pain.  Visit your dentist if you have not gone yet during your pregnancy. Use a soft toothbrush to brush your teeth and be gentle when you floss.  A sexual relationship may be continued unless your health care provider directs you otherwise.  Continue to go to all your prenatal visits as directed by your health care provider. SEEK MEDICAL CARE IF:   You have dizziness.  You have mild pelvic cramps, pelvic pressure, or nagging pain in the abdominal area.  You have persistent nausea, vomiting, or diarrhea.  You have a bad smelling vaginal discharge.  You have pain with urination. SEEK IMMEDIATE MEDICAL CARE IF:   You have a fever.  You are leaking fluid from your vagina.  You have spotting or bleeding from your vagina.  You have severe abdominal cramping or pain.  You have rapid weight gain or loss.  You have shortness of breath with chest pain.  You notice sudden  or extreme swelling of your face, hands, ankles, feet, or legs.  You have not felt your baby move in over an hour.  You have severe headaches that do not go away with medicine.  You have vision changes. Document Released: 04/28/2001 Document Revised: 05/09/2013 Document Reviewed: 07/05/2012 [employer] Patient Information 2015 [employer]. This information is not intended to replace advice given to you by your health care provider. Make sure you discuss any questions you have with your health care provider.  

## 2013-11-16 LAB — GC/CHLAMYDIA PROBE AMP
CT PROBE, AMP APTIMA: NEGATIVE
GC Probe RNA: NEGATIVE

## 2013-11-18 ENCOUNTER — Encounter: Payer: Self-pay | Admitting: Women's Health

## 2013-11-23 ENCOUNTER — Telehealth: Payer: Self-pay | Admitting: *Deleted

## 2013-11-23 MED ORDER — PRENATAL PLUS 27-1 MG PO TABS: 1.0000 | ORAL_TABLET | Freq: Every day | ORAL | Status: DC

## 2013-11-23 NOTE — Telephone Encounter (Signed)
Pt needs Rx for PNV. Advised pt that I will send Rx to JAG and see if she will approve it and to check back with her pharmacy later this afternoon.

## 2013-11-23 NOTE — Telephone Encounter (Signed)
Refilled prenatals

## 2013-12-13 ENCOUNTER — Ambulatory Visit (INDEPENDENT_AMBULATORY_CARE_PROVIDER_SITE_OTHER): Payer: No Typology Code available for payment source | Admitting: Women's Health

## 2013-12-13 ENCOUNTER — Encounter: Payer: Self-pay | Admitting: Women's Health

## 2013-12-13 VITALS — BP 102/62 | Wt 117.0 lb

## 2013-12-13 DIAGNOSIS — O2390 Unspecified genitourinary tract infection in pregnancy, unspecified trimester: Secondary | ICD-10-CM

## 2013-12-13 DIAGNOSIS — Z3482 Encounter for supervision of other normal pregnancy, second trimester: Secondary | ICD-10-CM

## 2013-12-13 DIAGNOSIS — A599 Trichomoniasis, unspecified: Secondary | ICD-10-CM

## 2013-12-13 DIAGNOSIS — Z1389 Encounter for screening for other disorder: Secondary | ICD-10-CM

## 2013-12-13 DIAGNOSIS — Z331 Pregnant state, incidental: Secondary | ICD-10-CM

## 2013-12-13 DIAGNOSIS — Z3480 Encounter for supervision of other normal pregnancy, unspecified trimester: Secondary | ICD-10-CM

## 2013-12-13 LAB — POCT URINALYSIS DIPSTICK
Glucose, UA: NEGATIVE
Ketones, UA: NEGATIVE
Leukocytes, UA: NEGATIVE
Nitrite, UA: NEGATIVE

## 2013-12-13 MED ORDER — METRONIDAZOLE 500 MG PO TABS: 2000.0000 mg | ORAL_TABLET | Freq: Once | ORAL | Status: DC

## 2013-12-13 NOTE — Patient Instructions (Addendum)
You will have your sugar test next visit.  Please do not eat or drink anything after midnight the night before you come, not even water.  You will be here for at least two hours.     Tdap vaccine at 28 weeks at health department or your family doctor, recommended for you and anyone who will be around the baby a lot   Call the office [phone]) or go to [facility] if:  You begin to have strong, frequent contractions  Your water breaks.  Sometimes it is a big gush of fluid, sometimes it is just a trickle that keeps getting your panties wet or running down your legs  You have vaginal bleeding.  It is normal to have a small amount of spotting if your cervix was checked.   You don't feel your baby moving like normal.  If you don't, get you something to eat and drink and lay down and focus on feeling your baby move.  You should feel at least 10 movements in 2 hours.  If you don't, you should call the office or go to [facility].    Third Trimester of Pregnancy The third trimester is from week 29 through week 42, months 7 through 9. The third trimester is a time when the fetus is growing rapidly. At the end of the ninth month, the fetus is about 20 inches in length and weighs 6-10 pounds.  BODY CHANGES Your body goes through many changes during pregnancy. The changes vary from woman to woman.   Your weight will continue to increase. You can expect to gain 25-35 pounds (11-16 kg) by the end of the pregnancy.  You may begin to get stretch marks on your hips, abdomen, and breasts.  You may urinate more often because the fetus is moving lower into your pelvis and pressing on your bladder.  You may develop or continue to have heartburn as a result of your pregnancy.  You may develop constipation because certain hormones are causing the muscles that push waste through your intestines to slow down.  You may develop hemorrhoids or swollen, bulging veins (varicose veins).  You may have  pelvic pain because of the weight gain and pregnancy hormones relaxing your joints between the bones in your pelvis. Backaches may result from overexertion of the muscles supporting your posture.  You may have changes in your hair. These can include thickening of your hair, rapid growth, and changes in texture. Some women also have hair loss during or after pregnancy, or hair that feels dry or thin. Your hair will most likely return to normal after your baby is born.  Your breasts will continue to grow and be tender. A yellow discharge may leak from your breasts called colostrum.  Your belly button may stick out.  You may feel short of breath because of your expanding uterus.  You may notice the fetus "dropping," or moving lower in your abdomen.  You may have a bloody mucus discharge. This usually occurs a few days to a week before labor begins.  Your cervix becomes thin and soft (effaced) near your due date. WHAT TO EXPECT AT YOUR PRENATAL EXAMS  You will have prenatal exams every 2 weeks until week 36. Then, you will have weekly prenatal exams. During a routine prenatal visit:  You will be weighed to make sure you and the fetus are growing normally.  Your blood pressure is taken.  Your abdomen will be measured to track your baby's growth.  The fetal heartbeat will be listened to.  Any test results from the previous visit will be discussed.  You may have a cervical check near your due date to see if you have effaced. At around 36 weeks, your caregiver will check your cervix. At the same time, your caregiver will also perform a test on the secretions of the vaginal tissue. This test is to determine if a type of bacteria, Group B streptococcus, is present. Your caregiver will explain this further. Your caregiver may ask you:  What your birth plan is.  How you are feeling.  If you are feeling the baby move.  If you have had any abnormal symptoms, such as leaking fluid, bleeding,  severe headaches, or abdominal cramping.  If you have any questions. Other tests or screenings that may be performed during your third trimester include:  Blood tests that check for low iron levels (anemia).  Fetal testing to check the health, activity level, and growth of the fetus. Testing is done if you have certain medical conditions or if there are problems during the pregnancy. FALSE LABOR You may feel small, irregular contractions that eventually go away. These are called Braxton Hicks contractions, or false labor. Contractions may last for hours, days, or even weeks before true labor sets in. If contractions come at regular intervals, intensify, or become painful, it is best to be seen by your caregiver.  SIGNS OF LABOR   Menstrual-like cramps.  Contractions that are 5 minutes apart or less.  Contractions that start on the top of the uterus and spread down to the lower abdomen and back.  A sense of increased pelvic pressure or back pain.  A watery or bloody mucus discharge that comes from the vagina. If you have any of these signs before the 37th week of pregnancy, call your caregiver right away. You need to go to the hospital to get checked immediately. HOME CARE INSTRUCTIONS   Avoid all smoking, herbs, alcohol, and unprescribed drugs. These chemicals affect the formation and growth of the baby.  Follow your caregiver's instructions regarding medicine use. There are medicines that are either safe or unsafe to take during pregnancy.  Exercise only as directed by your caregiver. Experiencing uterine cramps is a good sign to stop exercising.  Continue to eat regular, healthy meals.  Wear a good support bra for breast tenderness.  Do not use hot tubs, steam rooms, or saunas.  Wear your seat belt at all times when driving.  Avoid raw meat, uncooked cheese, cat litter boxes, and soil used by cats. These carry germs that can cause birth defects in the baby.  Take your  prenatal vitamins.  Try taking a stool softener (if your caregiver approves) if you develop constipation. Eat more high-fiber foods, such as fresh vegetables or fruit and whole grains. Drink plenty of fluids to keep your urine clear or pale yellow.  Take warm sitz baths to soothe any pain or discomfort caused by hemorrhoids. Use hemorrhoid cream if your caregiver approves.  If you develop varicose veins, wear support hose. Elevate your feet for 15 minutes, 3-4 times a day. Limit salt in your diet.  Avoid heavy lifting, wear low heal shoes, and practice good posture.  Rest a lot with your legs elevated if you have leg cramps or low back pain.  Visit your dentist if you have not gone during your pregnancy. Use a soft toothbrush to brush your teeth and be gentle when you floss.  A sexual relationship  may be continued unless your caregiver directs you otherwise.  Do not travel far distances unless it is absolutely necessary and only with the approval of your caregiver.  Take prenatal classes to understand, practice, and ask questions about the labor and delivery.  Make a trial run to the hospital.  Pack your hospital bag.  Prepare the baby's nursery.  Continue to go to all your prenatal visits as directed by your caregiver. SEEK MEDICAL CARE IF:  You are unsure if you are in labor or if your water has broken.  You have dizziness.  You have mild pelvic cramps, pelvic pressure, or nagging pain in your abdominal area.  You have persistent nausea, vomiting, or diarrhea.  You have a bad smelling vaginal discharge.  You have pain with urination. SEEK IMMEDIATE MEDICAL CARE IF:   You have a fever.  You are leaking fluid from your vagina.  You have spotting or bleeding from your vagina.  You have severe abdominal cramping or pain.  You have rapid weight loss or gain.  You have shortness of breath with chest pain.  You notice sudden or extreme swelling of your face, hands,  ankles, feet, or legs.  You have not felt your baby move in over an hour.  You have severe headaches that do not go away with medicine.  You have vision changes. Document Released: 04/28/2001 Document Revised: 05/09/2013 Document Reviewed: 07/05/2012 [facility]ExitCare Patient Information 2015 [place]. This information is not intended to replace advice given to you by your health care provider. Make sure you discuss any questions you have with your health care provider.  Trichomoniasis Trichomoniasis is an infection caused by an organism called Trichomonas. The infection can affect both women and men. In women, the outer female genitalia and the vagina are affected. In men, the penis is mainly affected, but the prostate and other reproductive organs can also be involved. Trichomoniasis is a sexually transmitted infection (STI) and is most often passed to another person through sexual contact.  RISK FACTORS  Having unprotected sexual intercourse.  Having sexual intercourse with an infected partner. SIGNS AND SYMPTOMS  Symptoms of trichomoniasis in women include:  Abnormal gray-green frothy vaginal discharge.  Itching and irritation of the vagina.  Itching and irritation of the area outside the vagina. Symptoms of trichomoniasis in men include:   Penile discharge with or without pain.  Pain during urination. This results from inflammation of the urethra. DIAGNOSIS  Trichomoniasis may be found during a Pap test or physical exam. Your health care provider may use one of the following methods to help diagnose this infection:  Examining vaginal discharge under a microscope. For men, urethral discharge would be examined.  Testing the pH of the vagina with a test tape.  Using a vaginal swab test that checks for the Trichomonas organism. A test is available that provides results within a few minutes.  Doing a culture test for the organism. This is not usually needed. TREATMENT   You  may be given medicine to fight the infection. Women should inform their health care provider if they could be or are pregnant. Some medicines used to treat the infection should not be taken during pregnancy.  Your health care provider may recommend over-the-counter medicines or creams to decrease itching or irritation.  Your sexual partner will need to be treated if infected. HOME CARE INSTRUCTIONS   Take medicines only as directed by your health care provider.  Take over-the-counter medicine for itching or irritation as directed  by your health care provider.  Do not have sexual intercourse while you have the infection.  Women should not douche or wear tampons while they have the infection.  Discuss your infection with your partner. Your partner may have gotten the infection from you, or you may have gotten it from your partner.  Have your sex partner get examined and treated if necessary.  Practice safe, informed, and protected sex.  See your health care provider for other STI testing. SEEK MEDICAL CARE IF:   You still have symptoms after you finish your medicine.  You develop abdominal pain.  You have pain when you urinate.  You have bleeding after sexual intercourse.  You develop a rash.  Your medicine makes you sick or makes you throw up (vomit). MAKE SURE YOU:  Understand these instructions.  Will watch your condition.  Will get help right away if you are not doing well or get worse. Document Released: 10/28/2000 Document Revised: 09/18/2013 Document Reviewed: 02/13/2013 [facility] Patient Information 2015 [place], [place]. This information is not intended to replace advice given to you by your health care provider. Make sure you discuss any questions you have with your health care provider.

## 2013-12-13 NOTE — Progress Notes (Signed)
Low-risk OB appointment [id number] Estimated Date of Delivery: 03/12/14 BP 102/62  Wt 117 lb (53.071 kg)  LMP 03/20/2013  BP, weight, and urine reviewed.  Refer to obstetrical flow sheet for FH & FHR.  Did not get scheduled for PN2 at checkout last visit (was on LOS).  Reports good fm.  Denies regular uc's, lof, vb, or uti s/s. White malodorous d/c w/ itching x 1-2wks.  Spec exam: mod amount thin white malodorous d/c, cx visually closed Wet prep: +trichomonas, Rx metronidazole 2gm po x 1, she will call me back w/ partners info Reviewed ptl s/s, fkc. Plan:  Continue routine obstetrical care  F/U asap for pn2, then 3wks for OB appointment and trich POC Recommended Tdap at HD/PCP per CDC guidelines.

## 2013-12-14 ENCOUNTER — Other Ambulatory Visit: Payer: No Typology Code available for payment source

## 2013-12-14 DIAGNOSIS — Z3482 Encounter for supervision of other normal pregnancy, second trimester: Secondary | ICD-10-CM

## 2013-12-14 LAB — CBC
HCT: 33.2 % — ABNORMAL LOW (ref 36.0–46.0)
Hemoglobin: 11.2 g/dL — ABNORMAL LOW (ref 12.0–15.0)
MCH: 31.5 pg (ref 26.0–34.0)
MCHC: 33.7 g/dL (ref 30.0–36.0)
MCV: 93.5 fL (ref 78.0–100.0)
PLATELETS: 139 10*3/uL — AB (ref 150–400)
RBC: 3.55 MIL/uL — ABNORMAL LOW (ref 3.87–5.11)
RDW: 13.8 % (ref 11.5–15.5)
WBC: 6 10*3/uL (ref 4.0–10.5)

## 2013-12-15 LAB — GLUCOSE TOLERANCE, 2 HOURS W/ 1HR
GLUCOSE, FASTING: 75 mg/dL (ref 70–99)
Glucose, 1 hour: 107 mg/dL (ref 70–170)
Glucose, 2 hour: 87 mg/dL (ref 70–139)

## 2013-12-15 LAB — HIV ANTIBODY (ROUTINE TESTING W REFLEX): HIV 1&2 Ab, 4th Generation: NONREACTIVE

## 2013-12-15 LAB — ANTIBODY SCREEN: ANTIBODY SCREEN: NEGATIVE

## 2013-12-15 LAB — RPR

## 2013-12-15 LAB — HSV 2 ANTIBODY, IGG: HSV 2 Glycoprotein G Ab, IgG: 2.93 IV — ABNORMAL HIGH

## 2013-12-18 ENCOUNTER — Encounter: Payer: Self-pay | Admitting: Adult Health

## 2014-01-03 ENCOUNTER — Ambulatory Visit (INDEPENDENT_AMBULATORY_CARE_PROVIDER_SITE_OTHER): Payer: No Typology Code available for payment source | Admitting: Women's Health

## 2014-01-03 ENCOUNTER — Encounter: Payer: Self-pay | Admitting: Women's Health

## 2014-01-03 ENCOUNTER — Telehealth: Payer: Self-pay | Admitting: Women's Health

## 2014-01-03 VITALS — BP 98/56 | Wt 122.0 lb

## 2014-01-03 DIAGNOSIS — O360990 Maternal care for other rhesus isoimmunization, unspecified trimester, not applicable or unspecified: Secondary | ICD-10-CM

## 2014-01-03 DIAGNOSIS — Z331 Pregnant state, incidental: Secondary | ICD-10-CM

## 2014-01-03 DIAGNOSIS — Z3480 Encounter for supervision of other normal pregnancy, unspecified trimester: Secondary | ICD-10-CM

## 2014-01-03 DIAGNOSIS — Z1389 Encounter for screening for other disorder: Secondary | ICD-10-CM

## 2014-01-03 DIAGNOSIS — Z6791 Unspecified blood type, Rh negative: Secondary | ICD-10-CM

## 2014-01-03 DIAGNOSIS — Z3483 Encounter for supervision of other normal pregnancy, third trimester: Secondary | ICD-10-CM

## 2014-01-03 DIAGNOSIS — A599 Trichomoniasis, unspecified: Secondary | ICD-10-CM

## 2014-01-03 DIAGNOSIS — R768 Other specified abnormal immunological findings in serum: Secondary | ICD-10-CM | POA: Insufficient documentation

## 2014-01-03 LAB — POCT URINALYSIS DIPSTICK
Glucose, UA: NEGATIVE
Ketones, UA: NEGATIVE
LEUKOCYTES UA: NEGATIVE
NITRITE UA: NEGATIVE

## 2014-01-03 LAB — POCT WET PREP (WET MOUNT): Clue Cells Wet Prep Whiff POC: NEGATIVE

## 2014-01-03 MED ORDER — RHO D IMMUNE GLOBULIN 1500 UNIT/2ML IJ SOSY
300.0000 ug | PREFILLED_SYRINGE | Freq: Once | INTRAMUSCULAR | Status: AC
  Administered 2014-01-03: 300 ug via INTRAMUSCULAR

## 2014-01-03 MED ORDER — BETAMETHASONE VALERATE 0.1 % EX CREA: TOPICAL_CREAM | Freq: Every day | CUTANEOUS | Status: DC | PRN

## 2014-01-03 NOTE — Patient Instructions (Signed)
Call the office ([phone]) or go to [facility] if:  You begin to have strong, frequent contractions  Your water breaks.  Sometimes it is a big gush of fluid, sometimes it is just a trickle that keeps getting your panties wet or running down your legs  You have vaginal bleeding.  It is normal to have a small amount of spotting if your cervix was checked.   You don't feel your baby moving like normal.  If you don't, get you something to eat and drink and lay down and focus on feeling your baby move.  You should feel at least 10 movements in 2 hours.  If you don't, you should call the office or go to [facility].   Herpes During and After Pregnancy Genital herpes is a sexually transmitted infection (STI). It is caused by a virus and can be very serious during pregnancy. The greatest concern is passing the virus to the fetus and newborn. The virus is more likely to be passed to the newborn during delivery if the mother becomes infected for the first time late in her pregnancy (primary infection). It is less common for the virus to pass to the newborn if the mother had herpes before becoming pregnant. This is because antibodies against the virus develop over a period of time. These antibodies help protect the baby. Lastly, the infection can pass to the fetus through the placenta. This can happen if the mother gets herpes for the first time in the first 3 months of her pregnancy (first trimester). This can possibly cause a miscarriage or birth defects in the baby. TREATMENT DURING PREGNANCY Medicines may be prescribed that are safe for the mother and the fetus. The medicine can lessen symptoms or prevent a recurrence of the infection. If the infection happened before becoming pregnant, medicine may be prescribed in the last 4 weeks of the pregnancy. This can prevent a recurrent infection at the time of delivery.  RECOMMENDED DELIVERY METHOD If an active, recurrent infection is present at the time  delivery, the baby should be delivered by cesarean delivery. This is because the virus can pass to the baby through an infected birth canal. This can cause severe problems for the baby. If the infection happens for the first time late in the pregnancy, the caregiver may also recommend a cesarean delivery. With a new infection, the body has not had the time to build up enough antibodies against the virus to protect the baby from getting the infection. Even if the birth canal does not have visible sores (lesions) from the herpes virus, there is still that chance that the virus can spread to the baby. A cesarean delivery should be done if there is any signs or feeling of an infection being present in the genital area. Cesarean delivery is not recommended for women with a history of herpes infection but no evidence of active genital lesions at the time of delivery. Lesions that have crusted fully are considered healed and not active. BREASTFEEDING  Women infected with genital herpes can breastfeed their baby. The virus will not be present in the breast milk. If lesions are present on the breast, the baby should not breastfeed from the affected breast(s). HOW TO PREVENT PASSING HERPES TO YOUR BABY AFTER DELIVERY  Wash your hands with soap and water often and before touching your baby.  If you have an outbreak, keep the area clean and covered.  Try to avoid physical and stressful situations that may bring on   an outbreak. SEEK IMMEDIATE MEDICAL CARE IF:   You have an outbreak during pregnancy and cannot urinate.  You have an outbreak anytime during your pregnancy and especially in the last 3 months of the pregnancy.  You think you are having an allergic reaction or side effects from the medicine you are taking. Document Released: 08/10/2000 Document Revised: 07/27/2011 Document Reviewed: 04/14/2011 Exit[employer] Patient Information 2015 [employer]. This information is not intended to replace advice  given to you by your health care provider. Make sure you discuss any questions you have with your health care provider.  

## 2014-01-03 NOTE — Progress Notes (Signed)
Low-risk OB appointment [id number] Estimated Date of Delivery: 03/12/14 BP 98/56  Wt 122 lb (55.339 kg)  LMP 03/20/2013  BP, weight, and urine reviewed.  Refer to obstetrical flow sheet for FH & FHR.  Reports good fm.  Denies regular uc's, lof, vb, or uti s/s. Has eczema on face, has been using family member's betamethasone cream w/ relief, requests rx- to use as little as possible.  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 Plan:  Continue routine obstetrical care  F/U in 2wks for OB appointment

## 2014-01-03 NOTE — Addendum Note (Signed)
Addended by: Shawna ClampBOOKER, Burlon Centrella R on: 01/03/2014 11:21 AM   Modules accepted: Orders

## 2014-01-03 NOTE — Telephone Encounter (Signed)
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  Cheral MarkerKimberly R. Dominyk Law, CNM, [facility]NP-BC 01/03/2014 4:55 PM

## 2014-01-17 ENCOUNTER — Encounter: Payer: Self-pay | Admitting: Women's Health

## 2014-01-17 ENCOUNTER — Ambulatory Visit (INDEPENDENT_AMBULATORY_CARE_PROVIDER_SITE_OTHER): Payer: No Typology Code available for payment source | Admitting: Women's Health

## 2014-01-17 VITALS — BP 112/64 | Wt 122.0 lb

## 2014-01-17 DIAGNOSIS — Z331 Pregnant state, incidental: Secondary | ICD-10-CM

## 2014-01-17 DIAGNOSIS — Z3483 Encounter for supervision of other normal pregnancy, third trimester: Secondary | ICD-10-CM

## 2014-01-17 DIAGNOSIS — Z1389 Encounter for screening for other disorder: Secondary | ICD-10-CM

## 2014-01-17 DIAGNOSIS — O360131 Maternal care for anti-D [Rh] antibodies, third trimester, fetus 1: Secondary | ICD-10-CM

## 2014-01-17 DIAGNOSIS — R768 Other specified abnormal immunological findings in serum: Secondary | ICD-10-CM

## 2014-01-17 DIAGNOSIS — Z3480 Encounter for supervision of other normal pregnancy, unspecified trimester: Secondary | ICD-10-CM

## 2014-01-17 LAB — POCT URINALYSIS DIPSTICK
GLUCOSE UA: NEGATIVE
Ketones, UA: NEGATIVE
NITRITE UA: NEGATIVE

## 2014-01-17 MED ORDER — ACYCLOVIR 400 MG PO TABS: 400.0000 mg | ORAL_TABLET | Freq: Three times a day (TID) | ORAL | Status: DC

## 2014-01-17 NOTE — Progress Notes (Signed)
Low-risk OB appointment [id number] [redacted]w[redacted]d Estimated Date of Delivery: 03/12/14 BP 112/64  Wt 122 lb (55.339 kg)  LMP 03/20/2013  BP, weight, and urine reviewed.  Refer to obstetrical flow sheet for FH & FHR.  Reports good fm.  Denies regular uc's, lof, vb, or uti s/s. No complaints. Got tdap at [facility] ~3wks ago Reviewed ptl s/s, fkc. Rx acyclovir to begin at 34wks.  Plan:  Continue routine obstetrical care  F/U in 2wk for OB appointment

## 2014-01-17 NOTE — Patient Instructions (Addendum)
Call the office [phone]) or go to [facility] if:  You begin to have strong, frequent contractions  Your water breaks.  Sometimes it is a big gush of fluid, sometimes it is just a trickle that keeps getting your panties wet or running down your legs  You have vaginal bleeding.  It is normal to have a small amount of spotting if your cervix was checked.   You don't feel your baby moving like normal.  If you don't, get you something to eat and drink and lay down and focus on feeling your baby move.  You should feel at least 10 movements in 2 hours.  If you don't, you should call the office or go to [facility].    [place] [facility]:  [facility] [phone]            [facility] [phone]                 [facility] [phone] (usually doesn't accept new patients unless you have family there already, you are always welcome to call and ask)             [facility] ([address]) [phone]   [facility] Pediatricians:   [facility]: [phone]  [facility]: [phone]   Third Trimester of Pregnancy The third trimester is from week 29 through week 42, months 7 through 9. The third trimester is a time when the fetus is growing rapidly. At the end of the ninth month, the fetus is about 20 inches in length and weighs 6-10 pounds.  BODY CHANGES Your body goes through many changes during pregnancy. The changes vary from woman to woman.   Your weight will continue to increase. You can expect to gain 25-35 pounds (11-16 kg) by the end of the pregnancy.  You may begin to get stretch marks on your hips, abdomen, and breasts.  You may urinate more often because the fetus is moving lower into your pelvis and pressing on your bladder.  You may develop or continue to have heartburn as a result of your pregnancy.  You may develop constipation because certain  hormones are causing the muscles that push waste through your intestines to slow down.  You may develop hemorrhoids or swollen, bulging veins (varicose veins).  You may have pelvic pain because of the weight gain and pregnancy hormones relaxing your joints between the bones in your pelvis. Backaches may result from overexertion of the muscles supporting your posture.  You may have changes in your hair. These can include thickening of your hair, rapid growth, and changes in texture. Some women also have hair loss during or after pregnancy, or hair that feels dry or thin. Your hair will most likely return to normal after your baby is born.  Your breasts will continue to grow and be tender. A yellow discharge may leak from your breasts called colostrum.  Your belly button may stick out.  You may feel short of breath because of your expanding uterus.  You may notice the fetus "dropping," or moving lower in your abdomen.  You may have a bloody mucus discharge. This usually occurs a few days to a week before labor begins.  Your cervix becomes thin and soft (effaced) near your due date. WHAT TO EXPECT AT YOUR PRENATAL EXAMS  You will have prenatal exams every 2 weeks until week 36. Then, you will have weekly prenatal exams. During a routine prenatal  visit:  You will be weighed to make sure you and the fetus are growing normally.  Your blood pressure is taken.  Your abdomen will be measured to track your baby's growth.  The fetal heartbeat will be listened to.  Any test results from the previous visit will be discussed.  You may have a cervical check near your due date to see if you have effaced. At around 36 weeks, your caregiver will check your cervix. At the same time, your caregiver will also perform a test on the secretions of the vaginal tissue. This test is to determine if a type of bacteria, Group B streptococcus, is present. Your caregiver will explain this further. Your caregiver  may ask you:  What your birth plan is.  How you are feeling.  If you are feeling the baby move.  If you have had any abnormal symptoms, such as leaking fluid, bleeding, severe headaches, or abdominal cramping.  If you have any questions. Other tests or screenings that may be performed during your third trimester include:  Blood tests that check for low iron levels (anemia).  Fetal testing to check the health, activity level, and growth of the fetus. Testing is done if you have certain medical conditions or if there are problems during the pregnancy. FALSE LABOR You may feel small, irregular contractions that eventually go away. These are called Braxton Hicks contractions, or false labor. Contractions may last for hours, days, or even weeks before true labor sets in. If contractions come at regular intervals, intensify, or become painful, it is best to be seen by your caregiver.  SIGNS OF LABOR   Menstrual-like cramps.  Contractions that are 5 minutes apart or less.  Contractions that start on the top of the uterus and spread down to the lower abdomen and back.  A sense of increased pelvic pressure or back pain.  A watery or bloody mucus discharge that comes from the vagina. If you have any of these signs before the 37th week of pregnancy, call your caregiver right away. You need to go to the hospital to get checked immediately. HOME CARE INSTRUCTIONS   Avoid all smoking, herbs, alcohol, and unprescribed drugs. These chemicals affect the formation and growth of the baby.  Follow your caregiver's instructions regarding medicine use. There are medicines that are either safe or unsafe to take during pregnancy.  Exercise only as directed by your caregiver. Experiencing uterine cramps is a good sign to stop exercising.  Continue to eat regular, healthy meals.  Wear a good support bra for breast tenderness.  Do not use hot tubs, steam rooms, or saunas.  Wear your seat belt at all  times when driving.  Avoid raw meat, uncooked cheese, cat litter boxes, and soil used by cats. These carry germs that can cause birth defects in the baby.  Take your prenatal vitamins.  Try taking a stool softener (if your caregiver approves) if you develop constipation. Eat more high-fiber foods, such as fresh vegetables or fruit and whole grains. Drink plenty of fluids to keep your urine clear or pale yellow.  Take warm sitz baths to soothe any pain or discomfort caused by hemorrhoids. Use hemorrhoid cream if your caregiver approves.  If you develop varicose veins, wear support hose. Elevate your feet for 15 minutes, 3-4 times a day. Limit salt in your diet.  Avoid heavy lifting, wear low heal shoes, and practice good posture.  Rest a lot with your legs elevated if you have leg cramps   or low back pain.  Visit your dentist if you have not gone during your pregnancy. Use a soft toothbrush to brush your teeth and be gentle when you floss.  A sexual relationship may be continued unless your caregiver directs you otherwise.  Do not travel far distances unless it is absolutely necessary and only with the approval of your caregiver.  Take prenatal classes to understand, practice, and ask questions about the labor and delivery.  Make a trial run to the hospital.  Pack your hospital bag.  Prepare the baby's nursery.  Continue to go to all your prenatal visits as directed by your caregiver. SEEK MEDICAL CARE IF:  You are unsure if you are in labor or if your water has broken.  You have dizziness.  You have mild pelvic cramps, pelvic pressure, or nagging pain in your abdominal area.  You have persistent nausea, vomiting, or diarrhea.  You have a bad smelling vaginal discharge.  You have pain with urination. SEEK IMMEDIATE MEDICAL CARE IF:   You have a fever.  You are leaking fluid from your vagina.  You have spotting or bleeding from your vagina.  You have severe abdominal  cramping or pain.  You have rapid weight loss or gain.  You have shortness of breath with chest pain.  You notice sudden or extreme swelling of your face, hands, ankles, feet, or legs.  You have not felt your baby move in over an hour.  You have severe headaches that do not go away with medicine.  You have vision changes. Document Released: 04/28/2001 Document Revised: 05/09/2013 Document Reviewed: 07/05/2012 ExitCare Patient Information 2015 [employer]. This information is not intended to replace advice given to you by your health care provider. Make sure you discuss any questions you have with your health care provider.  

## 2014-01-29 ENCOUNTER — Telehealth: Payer: Self-pay | Admitting: Women's Health

## 2014-01-29 NOTE — Telephone Encounter (Signed)
Pt states was supposed to have a rx sent to [facility] pharmacy on 01/17/14 for Acyclovir and pharmacy is stating that they did not receive Rx.  I called [facility] pharmacy and spoke with pharmacy staff and they did receive the Rx but it was put back when pt did not pick it up after 1 week, they will refill for her.  Called pt back and informed her of this.

## 2014-01-31 ENCOUNTER — Ambulatory Visit (INDEPENDENT_AMBULATORY_CARE_PROVIDER_SITE_OTHER): Payer: No Typology Code available for payment source | Admitting: Women's Health

## 2014-01-31 ENCOUNTER — Encounter: Payer: Self-pay | Admitting: Women's Health

## 2014-01-31 VITALS — BP 104/50 | Wt 125.0 lb

## 2014-01-31 DIAGNOSIS — Z331 Pregnant state, incidental: Secondary | ICD-10-CM

## 2014-01-31 DIAGNOSIS — Z3480 Encounter for supervision of other normal pregnancy, unspecified trimester: Secondary | ICD-10-CM

## 2014-01-31 DIAGNOSIS — Z1389 Encounter for screening for other disorder: Secondary | ICD-10-CM

## 2014-01-31 LAB — POCT URINALYSIS DIPSTICK
Blood, UA: NEGATIVE
Glucose, UA: NEGATIVE
KETONES UA: NEGATIVE
Leukocytes, UA: NEGATIVE
Nitrite, UA: NEGATIVE
Protein, UA: NEGATIVE

## 2014-01-31 NOTE — Progress Notes (Signed)
Low-risk OB appointment G2P0010 [redacted]w[redacted]d Estimated Date of Delivery: 03/12/14 BP 104/50  Wt 125 lb (56.7 kg)  LMP 03/20/2013  BP, weight, and urine reviewed.  Refer to obstetrical flow sheet for FH & FHR.  Reports good fm.  Denies regular uc's, lof, vb, or uti s/s. Scratchy throat few days ago- went away, now just runny nose, no fever/chills, other sx.  Reviewed ptl s/s, fkc. Plan:  Continue routine obstetrical care  F/U in 2wks for OB appointment and gbs

## 2014-01-31 NOTE — Patient Instructions (Signed)
Circumcision: $507 at hospital, $244 at [facility], has to be paid up front before it is done. If you want the circumcision done at [facility] you can make payments during pregnancy. If you are interested in this, see receptionist at check-out.  If your baby is older than 28 days when you have the circumcision done at [facility], the fee will go up to $325.50.    Call the office [phone]) or go to [facility] if:  You begin to have strong, frequent contractions  Your water breaks.  Sometimes it is a big gush of fluid, sometimes it is just a trickle that keeps getting your panties wet or running down your legs  You have vaginal bleeding.  It is normal to have a small amount of spotting if your cervix was checked.   You don't feel your baby moving like normal.  If you don't, get you something to eat and drink and lay down and focus on feeling your baby move.  You should feel at least 10 movements in 2 hours.  If you don't, you should call the office or go to [facility].    Preterm Labor Information Preterm labor is when labor starts at less than 37 weeks of pregnancy. The normal length of a pregnancy is 39 to 41 weeks. CAUSES Often, there is no identifiable underlying cause as to why a woman goes into preterm labor. One of the most common known causes of preterm labor is infection. Infections of the uterus, cervix, vagina, amniotic sac, bladder, kidney, or even the lungs (pneumonia) can cause labor to start. Other suspected causes of preterm labor include:   Urogenital infections, such as yeast infections and bacterial vaginosis.   Uterine abnormalities (uterine shape, uterine septum, fibroids, or bleeding from the placenta).   A cervix that has been operated on (it may fail to stay closed).   Malformations in the fetus.   Multiple gestations (twins, triplets, and so on).   Breakage of the amniotic sac.  RISK FACTORS  Having a previous history of preterm labor.    Having premature rupture of membranes (PROM).   Having a placenta that covers the opening of the cervix (placenta previa).   Having a placenta that separates from the uterus (placental abruption).   Having a cervix that is too weak to hold the fetus in the uterus (incompetent cervix).   Having too much fluid in the amniotic sac (polyhydramnios).   Taking illegal drugs or smoking while pregnant.   Not gaining enough weight while pregnant.   Being younger than 64 and older than 24 years old.   Having a low socioeconomic status.   Being African American. SYMPTOMS Signs and symptoms of preterm labor include:   Menstrual-like cramps, abdominal pain, or back pain.  Uterine contractions that are regular, as frequent as six in an hour, regardless of their intensity (may be mild or painful).  Contractions that start on the top of the uterus and spread down to the lower abdomen and back.   A sense of increased pelvic pressure.   A watery or bloody mucus discharge that comes from the vagina.  TREATMENT Depending on the length of the pregnancy and other circumstances, your health care provider may suggest bed rest. If necessary, there are medicines that can be given to stop contractions and to mature the fetal lungs. If labor happens before 34 weeks of pregnancy, a prolonged hospital stay may be recommended. Treatment depends on the condition of both you  and the fetus.  WHAT SHOULD YOU DO IF YOU THINK YOU ARE IN PRETERM LABOR? Call your health care provider right away. You will need to go to the hospital to get checked immediately. HOW CAN YOU PREVENT PRETERM LABOR IN FUTURE PREGNANCIES? You should:   Stop smoking if you smoke.  Maintain healthy weight gain and avoid chemicals and drugs that are not necessary.  Be watchful for any type of infection.  Inform your health care provider if you have a known history of preterm labor. Document Released: 07/25/2003 Document  Revised: 01/04/2013 Document Reviewed: 06/06/2012 [facility] Patient Information 2015 [place], [place]. This information is not intended to replace advice given to you by your health care provider. Make sure you discuss any questions you have with your health care provider.

## 2014-02-14 ENCOUNTER — Encounter: Payer: No Typology Code available for payment source | Admitting: Women's Health

## 2014-02-16 ENCOUNTER — Ambulatory Visit (INDEPENDENT_AMBULATORY_CARE_PROVIDER_SITE_OTHER): Payer: No Typology Code available for payment source | Admitting: Obstetrics and Gynecology

## 2014-02-16 ENCOUNTER — Encounter: Payer: Self-pay | Admitting: Obstetrics and Gynecology

## 2014-02-16 VITALS — BP 90/60 | Wt 127.4 lb

## 2014-02-16 DIAGNOSIS — Z1159 Encounter for screening for other viral diseases: Secondary | ICD-10-CM

## 2014-02-16 DIAGNOSIS — Z3685 Encounter for antenatal screening for Streptococcus B: Secondary | ICD-10-CM

## 2014-02-16 DIAGNOSIS — Z3483 Encounter for supervision of other normal pregnancy, third trimester: Secondary | ICD-10-CM

## 2014-02-16 DIAGNOSIS — Z1389 Encounter for screening for other disorder: Secondary | ICD-10-CM

## 2014-02-16 DIAGNOSIS — Z118 Encounter for screening for other infectious and parasitic diseases: Secondary | ICD-10-CM

## 2014-02-16 LAB — OB RESULTS CONSOLE GC/CHLAMYDIA
Chlamydia: NEGATIVE
GC PROBE AMP, GENITAL: NEGATIVE

## 2014-02-16 LAB — POCT URINALYSIS DIPSTICK
Glucose, UA: NEGATIVE
KETONES UA: NEGATIVE
Leukocytes, UA: NEGATIVE
Nitrite, UA: NEGATIVE
PROTEIN UA: NEGATIVE

## 2014-02-16 NOTE — Progress Notes (Signed)
G2P0010 4118w4d Estimated Date of Delivery: 03/12/14  Blood pressure 90/60, weight 127 lb 6.4 oz (57.788 kg), last menstrual period 03/20/2013.   refer to the ob flow sheet for FH and FHR, also BP, Wt, Urine results:notable for nothing  Patient reports good fetal movement, denies any bleeding and no rupture of membranes symptoms or regular contractions. Patient complaints: none today.  Questions were answered. Plan:  Continued routine obstetrical care,   F/u in 1 week for prenatal visit   This chart was scribed by Leone PayorSonum Patel, Medical Scribe, for Dr. Christin BachJohn Montserrath Madding on 02/16/14 at 9:51 AM. This chart was reviewed by Dr. Christin BachJohn Anterio Scheel for accuracy.

## 2014-02-17 LAB — GC/CHLAMYDIA PROBE AMP
CT Probe RNA: NEGATIVE
GC Probe RNA: NEGATIVE

## 2014-02-18 ENCOUNTER — Encounter: Payer: Self-pay | Admitting: Obstetrics and Gynecology

## 2014-02-18 LAB — STREP B DNA PROBE: GBSP: NOT DETECTED

## 2014-02-19 ENCOUNTER — Telehealth: Payer: Self-pay | Admitting: *Deleted

## 2014-02-19 ENCOUNTER — Ambulatory Visit (INDEPENDENT_AMBULATORY_CARE_PROVIDER_SITE_OTHER): Payer: No Typology Code available for payment source | Admitting: Women's Health

## 2014-02-19 ENCOUNTER — Encounter: Payer: Self-pay | Admitting: Women's Health

## 2014-02-19 VITALS — BP 118/60 | Wt 131.0 lb

## 2014-02-19 DIAGNOSIS — Z331 Pregnant state, incidental: Secondary | ICD-10-CM

## 2014-02-19 DIAGNOSIS — Z23 Encounter for immunization: Secondary | ICD-10-CM

## 2014-02-19 DIAGNOSIS — Z1389 Encounter for screening for other disorder: Secondary | ICD-10-CM

## 2014-02-19 DIAGNOSIS — O26843 Uterine size-date discrepancy, third trimester: Secondary | ICD-10-CM

## 2014-02-19 DIAGNOSIS — Z3483 Encounter for supervision of other normal pregnancy, third trimester: Secondary | ICD-10-CM

## 2014-02-19 NOTE — Progress Notes (Signed)
Work-in Low-risk OB appointment G2P0010 270w0d Estimated Date of Delivery: 03/12/14 BP 118/60  Wt 131 lb (59.421 kg)  LMP 03/20/2013  BP, weight, and urine reviewed.  Refer to obstetrical flow sheet for FH & FHR.  Reports good fm.  Denies regular uc's, vb, or uti s/s. Has had daily am vomiting recently- had gush of fluid this am when she vomited, none since.  SSE: cx visually closed, no pooling of fluid, nitrazine neg, fern neg Reviewed labor s/s, fkc. Plan:  Continue routine obstetrical care  F/U asap for growth/afi u/s d/t s<d, then Friday as scheduled for OB appointment  Flu shot today

## 2014-02-19 NOTE — Patient Instructions (Signed)
Call the office ([phone]) or go to [facility] if:  You begin to have strong, frequent contractions  Your water breaks.  Sometimes it is a big gush of fluid, sometimes it is just a trickle that keeps getting your panties wet or running down your legs  You have vaginal bleeding.  It is normal to have a small amount of spotting if your cervix was checked.   You don't feel your baby moving like normal.  If you don't, get you something to eat and drink and lay down and focus on feeling your baby move.  You should feel at least 10 movements in 2 hours.  If you don't, you should call the office or go to [facility].    Braxton Hicks Contractions Contractions of the uterus can occur throughout pregnancy. Contractions are not always a sign that you are in labor.  WHAT ARE BRAXTON HICKS CONTRACTIONS?  Contractions that occur before labor are called Braxton Hicks contractions, or false labor. Toward the end of pregnancy (32-34 weeks), these contractions can develop more often and may become more forceful. This is not true labor because these contractions do not result in opening (dilatation) and thinning of the cervix. They are sometimes difficult to tell apart from true labor because these contractions can be forceful and people have different pain tolerances. You should not feel embarrassed if you go to the hospital with false labor. Sometimes, the only way to tell if you are in true labor is for your health care provider to look for changes in the cervix. If there are no prenatal problems or other health problems associated with the pregnancy, it is completely safe to be sent home with false labor and await the onset of true labor. HOW CAN YOU TELL THE DIFFERENCE BETWEEN TRUE AND FALSE LABOR? False Labor  The contractions of false labor are usually shorter and not as hard as those of true labor.   The contractions are usually irregular.   The contractions are often felt in the front of  the lower abdomen and in the groin.   The contractions may go away when you walk around or change positions while lying down.   The contractions get weaker and are shorter lasting as time goes on.   The contractions do not usually become progressively stronger, regular, and closer together as with true labor.  True Labor  Contractions in true labor last 30-70 seconds, become very regular, usually become more intense, and increase in frequency.   The contractions do not go away with walking.   The discomfort is usually felt in the top of the uterus and spreads to the lower abdomen and low back.   True labor can be determined by your health care provider with an exam. This will show that the cervix is dilating and getting thinner.  WHAT TO REMEMBER  Keep up with your usual exercises and follow other instructions given by your health care provider.   Take medicines as directed by your health care provider.   Keep your regular prenatal appointments.   Eat and drink lightly if you think you are going into labor.   If Braxton Hicks contractions are making you uncomfortable:   Change your position from lying down or resting to walking, or from walking to resting.   Sit and rest in a tub of warm water.   Drink 2-3 glasses of water. Dehydration may cause these contractions.   Do slow and deep breathing several times an hour.    WHEN SHOULD I SEEK IMMEDIATE MEDICAL CARE? Seek immediate medical care if:  Your contractions become stronger, more regular, and closer together.   You have fluid leaking or gushing from your vagina.   You have a fever.   You pass blood-tinged mucus.   You have vaginal bleeding.   You have continuous abdominal pain.   You have low back pain that you never had before.   You feel your baby's head pushing down and causing pelvic pressure.   Your baby is not moving as much as it used to.  Document Released: 05/04/2005 Document  Revised: 05/09/2013 Document Reviewed: 02/13/2013 ExitCare Patient Information 2015 Exit[employer], [employer]. This information is not intended to replace advice given to you by your health care provider. Make sure you discuss any questions you have with your health care provider.  

## 2014-02-19 NOTE — Telephone Encounter (Signed)
Pt states ate this morning had nausea and vomiting, when she vomited had a gush of fluids "clear water." pt states has not had this to happen before.

## 2014-02-20 ENCOUNTER — Ambulatory Visit (INDEPENDENT_AMBULATORY_CARE_PROVIDER_SITE_OTHER): Payer: No Typology Code available for payment source

## 2014-02-20 ENCOUNTER — Other Ambulatory Visit: Payer: Self-pay | Admitting: Women's Health

## 2014-02-20 DIAGNOSIS — O26843 Uterine size-date discrepancy, third trimester: Secondary | ICD-10-CM

## 2014-02-20 NOTE — Progress Notes (Signed)
U/S(37+1wks)-vtx active fetus, FHR-138 bpm, fluid WNL AFI-6.6cm SDP-2.0cm, posterior Gr 1 placenta, EFW 6 lb (22nd %tile)

## 2014-02-21 ENCOUNTER — Other Ambulatory Visit: Payer: Self-pay | Admitting: Women's Health

## 2014-02-22 ENCOUNTER — Other Ambulatory Visit: Payer: No Typology Code available for payment source

## 2014-02-23 ENCOUNTER — Encounter: Payer: Self-pay | Admitting: Obstetrics and Gynecology

## 2014-02-23 ENCOUNTER — Ambulatory Visit (INDEPENDENT_AMBULATORY_CARE_PROVIDER_SITE_OTHER): Payer: No Typology Code available for payment source | Admitting: Obstetrics and Gynecology

## 2014-02-23 VITALS — BP 100/66 | Wt 128.0 lb

## 2014-02-23 DIAGNOSIS — Z3483 Encounter for supervision of other normal pregnancy, third trimester: Secondary | ICD-10-CM

## 2014-02-23 LAB — POCT URINALYSIS DIPSTICK
Glucose, UA: NEGATIVE
KETONES UA: NEGATIVE
Leukocytes, UA: NEGATIVE
Nitrite, UA: NEGATIVE
PROTEIN UA: NEGATIVE
RBC UA: NEGATIVE

## 2014-02-23 NOTE — Progress Notes (Signed)
Pt denies any problems or concerns at this time.  

## 2014-02-23 NOTE — Progress Notes (Signed)
G2P0010 7782w4d Estimated Date of Delivery: 03/12/14  Lo risk pregnancy Blood pressure 100/66, weight 128 lb (58.06 kg), last menstrual period 03/20/2013.   refer to the ob flow sheet for FH and FHR, also BP, Wt, Urine results:notable for negative  Patient reports good fetal movement, denies any bleeding and no rupture of membranes symptoms or regular contractions. Patient complaints:she denies any complaints at this time. [facility] She states that she would like pills as her contraceptive method. She states that on the depo shot she bled irregularly  Fundal Height: 35 cm Fetal Heart Rate: 129  Questions were answered. Plan:  Continued routine obstetrical care, Lengthy discussion about contraceptive LTR with pt   F/u in 1 weeks for routine OB visit   This chart was scribed for Tilda BurrowJohn Norman Bier V, MD by Chestine SporeSoijett Blue, ED Scribe. The patient was seen in room 1 at 10:50 AM.

## 2014-03-05 ENCOUNTER — Encounter: Payer: Self-pay | Admitting: Obstetrics and Gynecology

## 2014-03-05 ENCOUNTER — Ambulatory Visit (INDEPENDENT_AMBULATORY_CARE_PROVIDER_SITE_OTHER): Payer: No Typology Code available for payment source | Admitting: Obstetrics and Gynecology

## 2014-03-05 VITALS — BP 100/62 | Wt 130.0 lb

## 2014-03-05 DIAGNOSIS — Z3483 Encounter for supervision of other normal pregnancy, third trimester: Secondary | ICD-10-CM

## 2014-03-05 DIAGNOSIS — Z331 Pregnant state, incidental: Secondary | ICD-10-CM

## 2014-03-05 DIAGNOSIS — Z1389 Encounter for screening for other disorder: Secondary | ICD-10-CM

## 2014-03-05 LAB — POCT URINALYSIS DIPSTICK
Blood, UA: NEGATIVE
Glucose, UA: NEGATIVE
KETONES UA: NEGATIVE
Leukocytes, UA: NEGATIVE
Nitrite, UA: NEGATIVE
Protein, UA: NEGATIVE

## 2014-03-05 NOTE — Progress Notes (Signed)
Pt denies any problems or concerns at this time.  

## 2014-03-05 NOTE — Progress Notes (Signed)
Patient ID: Sonia Gibson, female   DOB: 06/01/1990, 23 y.o.   MRN: [id number] G2P0010 5190w0d Estimated Date of Delivery: 03/12/14  Blood pressure 100/62, weight 130 lb (58.968 kg), last menstrual period 03/20/2013.   refer to the ob flow sheet for FH and FHR, also BP, Wt, Urine results:negative  Patient reports +good fetal movement, denies any bleeding and no rupture of membranes symptoms or regular contractions. Patient complaints: Pt is complaining of increased vaginal discharge.  Physical Exam: Cervix - tissues are soft, long, 0,  FHR - 131 FH - 36cm   Questions were answered. Assessment: 7090w0d, G2P0010  Plan:  Continued routine obstetrical care,  F/u in 1 weeks    This chart was scribed for Tilda BurrowJohn Melquisedec Journey V, MD by Carl Bestelina Holson, ED Scribe. This patient was seen in Room 1 and the patient's care was started at 11:50 AM.

## 2014-03-10 ENCOUNTER — Encounter (HOSPITAL_COMMUNITY): Payer: Self-pay | Admitting: *Deleted

## 2014-03-10 ENCOUNTER — Other Ambulatory Visit: Payer: Self-pay | Admitting: Women's Health

## 2014-03-10 ENCOUNTER — Inpatient Hospital Stay (HOSPITAL_COMMUNITY)
Admission: EM | Admit: 2014-03-10 | Discharge: 2014-03-12 | DRG: 774 | Disposition: A | Discharge status: Home / Self Care | Payer: Medicaid Other | Attending: Obstetrics & Gynecology | Admitting: Obstetrics & Gynecology

## 2014-03-10 DIAGNOSIS — O9832 Other infections with a predominantly sexual mode of transmission complicating childbirth: Principal | ICD-10-CM | POA: Diagnosis present

## 2014-03-10 DIAGNOSIS — A6000 Herpesviral infection of urogenital system, unspecified: Secondary | ICD-10-CM | POA: Diagnosis present

## 2014-03-10 DIAGNOSIS — O471 False labor at or after 37 completed weeks of gestation: Secondary | ICD-10-CM | POA: Diagnosis not present

## 2014-03-10 DIAGNOSIS — Z3A39 39 weeks gestation of pregnancy: Secondary | ICD-10-CM | POA: Diagnosis present

## 2014-03-10 DIAGNOSIS — O360131 Maternal care for anti-D [Rh] antibodies, third trimester, fetus 1: Secondary | ICD-10-CM

## 2014-03-10 DIAGNOSIS — Z3483 Encounter for supervision of other normal pregnancy, third trimester: Secondary | ICD-10-CM

## 2014-03-10 LAB — CBC
HCT: 34.1 % — ABNORMAL LOW (ref 36.0–46.0)
Hemoglobin: 11.7 g/dL — ABNORMAL LOW (ref 12.0–15.0)
MCH: 32.4 pg (ref 26.0–34.0)
MCHC: 34.3 g/dL (ref 30.0–36.0)
MCV: 94.5 fL (ref 78.0–100.0)
PLATELETS: 125 10*3/uL — AB (ref 150–400)
RBC: 3.61 MIL/uL — AB (ref 3.87–5.11)
RDW: 13.9 % (ref 11.5–15.5)
WBC: 13.4 10*3/uL — ABNORMAL HIGH (ref 4.0–10.5)

## 2014-03-10 LAB — RPR

## 2014-03-10 MED ORDER — DIPHENHYDRAMINE HCL 25 MG PO CAPS
25.0000 mg | ORAL_CAPSULE | Freq: Four times a day (QID) | ORAL | Status: DC | PRN
Start: 2014-03-10 — End: 2014-03-12

## 2014-03-10 MED ORDER — PRENATAL MULTIVITAMIN CH
1.0000 | ORAL_TABLET | Freq: Every day | ORAL | Status: DC
  Administered 2014-03-11: 1 via ORAL
  Filled 2014-03-10: qty 1

## 2014-03-10 MED ORDER — SODIUM CHLORIDE 0.9 % IJ SOLN: 3.0000 mL | Freq: Two times a day (BID) | INTRAMUSCULAR | Status: DC

## 2014-03-10 MED ORDER — LANOLIN HYDROUS EX OINT
TOPICAL_OINTMENT | CUTANEOUS | Status: DC | PRN
Start: 2014-03-10 — End: 2014-03-12

## 2014-03-10 MED ORDER — MEASLES, MUMPS & RUBELLA VAC ~~LOC~~ INJ: 0.5000 mL | INJECTION | Freq: Once | SUBCUTANEOUS | Status: DC

## 2014-03-10 MED ORDER — IBUPROFEN 600 MG PO TABS
600.0000 mg | ORAL_TABLET | Freq: Four times a day (QID) | ORAL | Status: DC
  Administered 2014-03-10 – 2014-03-12 (×7): 600 mg via ORAL
  Filled 2014-03-10 (×7): qty 1

## 2014-03-10 MED ORDER — BISACODYL 10 MG RE SUPP: 10.0000 mg | Freq: Every day | RECTAL | Status: DC | PRN

## 2014-03-10 MED ORDER — DIBUCAINE 1 % RE OINT
1.0000 | TOPICAL_OINTMENT | RECTAL | Status: DC | PRN
Start: 2014-03-10 — End: 2014-03-12

## 2014-03-10 MED ORDER — FLEET ENEMA 7-19 GM/118ML RE ENEM
1.0000 | ENEMA | Freq: Every day | RECTAL | Status: DC | PRN
Start: 2014-03-10 — End: 2014-03-12

## 2014-03-10 MED ORDER — ONDANSETRON HCL 4 MG/2ML IJ SOLN
4.0000 mg | INTRAMUSCULAR | Status: DC | PRN
Start: 2014-03-10 — End: 2014-03-12

## 2014-03-10 MED ORDER — OXYTOCIN 40 UNITS IN LACTATED RINGERS INFUSION - SIMPLE MED
250.0000 mL/h | INTRAVENOUS | Status: DC
  Administered 2014-03-10: 250 mL/h via INTRAVENOUS
  Filled 2014-03-10: qty 1000

## 2014-03-10 MED ORDER — ONDANSETRON HCL 4 MG PO TABS
4.0000 mg | ORAL_TABLET | ORAL | Status: DC | PRN
Start: 2014-03-10 — End: 2014-03-12

## 2014-03-10 MED ORDER — SIMETHICONE 80 MG PO CHEW: 80.0000 mg | CHEWABLE_TABLET | ORAL | Status: DC | PRN

## 2014-03-10 MED ORDER — SODIUM CHLORIDE 0.9 % IV SOLN
250.0000 mL | INTRAVENOUS | Status: DC | PRN
Start: 2014-03-10 — End: 2014-03-12

## 2014-03-10 MED ORDER — BENZOCAINE-MENTHOL 20-0.5 % EX AERO
1.0000 "application " | INHALATION_SPRAY | CUTANEOUS | Status: DC | PRN
  Administered 2014-03-10: 1 via TOPICAL
  Filled 2014-03-10: qty 56

## 2014-03-10 MED ORDER — ZOLPIDEM TARTRATE 5 MG PO TABS: 5.0000 mg | ORAL_TABLET | Freq: Every evening | ORAL | Status: DC | PRN

## 2014-03-10 MED ORDER — OXYTOCIN 40 UNITS IN LACTATED RINGERS INFUSION - SIMPLE MED
62.5000 mL/h | INTRAVENOUS | Status: DC | PRN
Start: 2014-03-10 — End: 2014-03-12

## 2014-03-10 MED ORDER — SODIUM CHLORIDE 0.9 % IJ SOLN: 3.0000 mL | INTRAMUSCULAR | Status: DC | PRN

## 2014-03-10 MED ORDER — WITCH HAZEL-GLYCERIN EX PADS: 1.0000 "application " | MEDICATED_PAD | CUTANEOUS | Status: DC | PRN

## 2014-03-10 MED ORDER — TETANUS-DIPHTH-ACELL PERTUSSIS 5-2.5-18.5 LF-MCG/0.5 IM SUSP: 0.5000 mL | Freq: Once | INTRAMUSCULAR | Status: DC

## 2014-03-10 MED ORDER — OXYCODONE-ACETAMINOPHEN 5-325 MG PO TABS: 1.0000 | ORAL_TABLET | ORAL | Status: DC | PRN

## 2014-03-10 MED ORDER — OXYCODONE-ACETAMINOPHEN 5-325 MG PO TABS: 2.0000 | ORAL_TABLET | ORAL | Status: DC | PRN

## 2014-03-10 MED ORDER — SENNOSIDES-DOCUSATE SODIUM 8.6-50 MG PO TABS
2.0000 | ORAL_TABLET | ORAL | Status: DC
  Administered 2014-03-10 – 2014-03-11 (×2): 2 via ORAL
  Filled 2014-03-10 (×2): qty 2

## 2014-03-10 MED FILL — Oxytocin Inj 10 Unit/ML: INTRAMUSCULAR | Qty: 4 | Status: AC

## 2014-03-10 MED FILL — Lactated Ringer's Solution: INTRAVENOUS | Qty: 1000 | Status: AC

## 2014-03-10 NOTE — ED Notes (Signed)
Bed: WA03 Expected date:  Expected time:  Means of arrival:  Comments: 

## 2014-03-10 NOTE — Progress Notes (Signed)
Pt transferred to romm 167, L&D, [facility].

## 2014-03-10 NOTE — ED Provider Notes (Signed)
CSN: [id number]     Arrival date & time 03/10/14  1056 History   First MD Initiated Contact with Patient 03/10/14 1109     Chief Complaint  Patient presents with  . Laboring     (Consider location/radiation/quality/duration/timing/severity/associated sxs/prior Treatment) HPI Comments: Pt presents with contractions.  [facility] [place].  Pt started having contractions during the night, [facility] way [facility] this [facility] and her water broke during car ride.  Her mom who was driving took a wrong turn and arrived at [facility].  Her contractions have been strengthening and becoming more frequent and painful across her abdomen and down into her groin.  She feels pressure into her vaginal area.   Past Medical History  Diagnosis Date  . Medical history non-contributory    Past Surgical History  Procedure Laterality Date  . No past surgeries     No family history on file. History  Substance Use Topics  . Smoking status: Never Smoker   . Smokeless tobacco: Never Used  . Alcohol Use: No   OB History   Grav Para Term Preterm Abortions TAB SAB Ect Mult Living   2    1 1          Review of Systems  Constitutional: Negative for fever.  Gastrointestinal: Positive for abdominal pain. Negative for nausea and vomiting.  Genitourinary: Positive for vaginal discharge and vaginal pain.  All other systems reviewed and are negative.     Allergies  Review of patient's allergies indicates no known allergies.  Home Medications   Prior to Admission medications   Medication Sig Start Date End Date Taking? Authorizing Provider  acyclovir (ZOVIRAX) 400 MG tablet Take 1 tablet (400 mg total) by mouth 3 (three) times daily. Start 9/14 01/17/14   Marge DuncansKimberly Randall Booker, CNM  betamethasone valerate (VALISONE) 0.1 % cream Apply very small amount of cream daily as needed 02/21/14   Marge DuncansKimberly Randall Booker, CNM   Doxylamine-Pyridoxine (DICLEGIS) 10-10 MG TBEC Take 10 mg by mouth See admin instructions. 10/30/13   Marge DuncansKimberly Randall Booker, CNM  prenatal vitamin w/FE, FA (PRENATAL 1 + 1) 27-1 MG TABS tablet Take 1 tablet by mouth daily at 12 noon. 11/23/13   Adline PotterJennifer A Griffin, NP   BP 111/72  Pulse 93  Temp(Src) 98.1 F (36.7 C) (Oral)  Resp 20  SpO2 100%  LMP 03/20/2013 Physical Exam  Constitutional: She is oriented to person, place, and time. She appears well-developed and well-nourished.  HENT:  Head: Normocephalic and atraumatic.  Eyes: Conjunctivae are normal.  Neck: Neck supple.  Cardiovascular: Normal rate, regular rhythm and normal heart sounds.   Pulmonary/Chest: Effort normal and breath sounds normal. No respiratory distress. She has no wheezes. She has no rales. She exhibits no tenderness.  Abdominal: Soft. Bowel sounds are normal. There is no rebound and no guarding.  +gravid uterus with tense contractions  Genitourinary:  +head of baby palpated at perineum, leakage of bloody, cloudy fluid  Musculoskeletal: Normal range of motion. She exhibits no edema.  Neurological: She is alert and oriented to person, place, and time.  Skin: Skin is warm and dry. No rash noted.  Psychiatric: She has a normal mood and affect.    ED Course  Procedures (including critical care time) Labs Review Labs Reviewed - No data to display  Imaging Review No results found.   EKG Interpretation None      MDM  Final diagnoses:  Labor and delivery indication for care or intervention     [facility] with imminent delivery. He contacted the rapid response OB nurse. I also contacted Dr. Debroah LoopArnold who is on call for OB. He's going to [facility]. Preparations were made for delivery.   Procedure: Delivery of Infant Using sterile gloves, a bimanual exam was performed. The baby's head was felt right at the perineum. There was no other presenting parts or  protruding umbilicus.  Patient was supported during contractions.  After about 4 contractions, she delivered with appears to be a full-term infant. The baby's mouth and nose were immediately suctioned with the bulb syringe. The baby started crying and had vigorous movements. The baby was placed on the moms stomach and covered with warm blankets and a hat. At this point the midwife arrived and took over for delivery of the placenta.  Pt was started on Pitocin by the midwife and uterus was massaged.  She has no heavy vaginal bleeding.  Baby was examined.  He is breathing normally with vigorous movement and good color.  He still has some cyanosis to hands only.  His RR are 50, HR 156, temp 97 axillary.  Blankets exchanged for clean, warm blankets and baby placed skin to skin on moms chest.  Pt and infant [facility] by [facility] to labor and delivery.  Registration advises that a chart on the baby cannot be made until pt arrives at [facility].   Rolan BuccoMelanie Lamoyne Hessel, MD 03/10/14 1233

## 2014-03-10 NOTE — ED Notes (Signed)
Pt presents stating that she is 9 mths pregnant and in active labor. Pt contractions are approx 1.5 minutes apart. Pt sts that this is her first preganancy. Pt has discolored fluid leaking from vaginal area. Pt is A&O. Dr. Fredderick PhenixBelfi and numerous staff at bedside. Infant warmer at bedside.  Pt currently on OB monitor

## 2014-03-10 NOTE — Progress Notes (Signed)
Baby V/S= AX temp 97.5. HR=160. Resp=56. Infant skin to skin with Mom.

## 2014-03-10 NOTE — Progress Notes (Signed)
Pt cleaned up. Infant placed skin to skin with mom.

## 2014-03-10 NOTE — Progress Notes (Signed)
Del of viable female infant. Apgars 9/9.

## 2014-03-10 NOTE — ED Notes (Signed)
Fetal HR is 141

## 2014-03-10 NOTE — Progress Notes (Signed)
Baby V/S- 97.4 ax, 145bpm, resp= 50. Infant placed skin to skin with Mom.

## 2014-03-10 NOTE — Progress Notes (Signed)
[facility] ED MD in attendance. Joellyn HaffKim Booker CNM on her way. Pt pushing. States her water broke about 20 min before she arrived. Fluid lt mec stained.Pt already placed on efm. FHR 130 BPM.

## 2014-03-10 NOTE — ED Notes (Signed)
OB nurse, Midwife, EDP at bedside, 2 staff nurses at bedside

## 2014-03-10 NOTE — ED Notes (Signed)
Pt reports to ED in labor, pt delivered female baby at [id number]. Cry, color, and motor function of infant are good.

## 2014-03-10 NOTE — Progress Notes (Signed)
[facility] by [facility].

## 2014-03-10 NOTE — H&P (Signed)
Sonia HartKiarra S Gibson is a 23 y.o. now 472P1011 female at 4453w5d by 2nd trimester u/s, who [facility]/ baby crowing, I was requested to go for delivery- baby was delivered by EDP just before I stepped into room. Infant on maternal abdomen, vigorous cry and doing well. No tubes to collect cord blood. I delivered placenta via controlled cord traction w/o any complications. FF u-1, small hemostatic periurethral w/o repair. Pt states mom took wrong turn and [facility]. Labor started early this am, water broke in car on way to hospital- clear fluid.  Initiated prenatal care at [facility] at 17.2 wks.   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  Past Medical History: Past Medical History  Diagnosis Date  . Medical history non-contributory     Past Surgical History: Past Surgical History  Procedure Laterality Date  . No past surgeries      Obstetrical History: OB History   Grav Para Term Preterm Abortions TAB SAB Ect Mult Living   2 1 1  1 1    1       Social History: History   Social History  . Marital Status: Single    Spouse Name: N/A    Number of Children: N/A  . Years of Education: N/A   Social History Main Topics  . Smoking status: Never Smoker   . Smokeless tobacco: Never Used  . Alcohol Use: No  . Drug Use: No  . Sexual Activity: Yes    Birth Control/ Protection: None   Other Topics Concern  . Not on file   Social History Narrative  . No narrative on file    Family History: No family history on file.  Allergies: No Known Allergies  Prescriptions prior to admission  Medication Sig Dispense Refill  . acyclovir (ZOVIRAX) 400 MG tablet Take 1 tablet (400 mg total) by mouth 3 (three) times daily. Start 9/14  90 tablet  3  . betamethasone valerate (VALISONE) 0.1 % cream Apply very small amount of cream daily as needed  30 g  0  . Doxylamine-Pyridoxine  (DICLEGIS) 10-10 MG TBEC Take 10 mg by mouth See admin instructions.  100 tablet  4  . prenatal vitamin w/FE, FA (PRENATAL 1 + 1) 27-1 MG TABS tablet Take 1 tablet by mouth daily at 12 noon.  30 each  12     Review of Systems  Pertinent pos/neg as indicated in HPI    Blood pressure 111/72, pulse 93, temperature 98.1 F (36.7 C), temperature source Oral, resp. rate 20, last menstrual period 03/20/2013, SpO2 100.00%, unknown if currently breastfeeding. 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 COCs  Circumcision: at [facility]  Marge DuncansBooker, Cyara Devoto Randall CNM, WHNP-BC 03/10/2014, 1:10  PM

## 2014-03-11 MED ORDER — RHO D IMMUNE GLOBULIN 1500 UNIT/2ML IJ SOSY
300.0000 ug | PREFILLED_SYRINGE | Freq: Once | INTRAMUSCULAR | Status: AC
  Administered 2014-03-11: 300 ug via INTRAMUSCULAR
  Filled 2014-03-11: qty 2

## 2014-03-11 NOTE — Progress Notes (Signed)
Post Partum Day 1 Subjective: Eating, drinking, voiding, ambulating well.  +flatus.  Lochia and pain wnl.  Denies dizziness, lightheadedness, or sob. No complaints.   Objective: Blood pressure 99/44, pulse 73, temperature 98 F (36.7 C), temperature source Oral, resp. rate 18, weight 58.968 kg (130 lb), last menstrual period 03/20/2013, SpO2 99.00%, unknown if currently breastfeeding.  Physical Exam:  General: alert, cooperative and no distress Lochia: appropriate Uterine Fundus: firm Incision: n/a DVT Evaluation: No evidence of DVT seen on physical exam. Negative Homan's sign. No cords or calf tenderness. No significant calf/ankle edema.   Recent Labs  03/10/14 1525  HGB 11.7*  HCT 34.1*    Assessment/Plan: Plan for discharge tomorrow and Contraception depo, circ at [facility]   LOS: 1 day   Marge DuncansBooker, Sonia Gibson 03/11/2014, 6:36 AM

## 2014-03-12 ENCOUNTER — Encounter: Payer: No Typology Code available for payment source | Admitting: Obstetrics & Gynecology

## 2014-03-12 LAB — RH IG WORKUP (INCLUDES ABO/RH)
ABO/RH(D): O NEG
ANTIBODY SCREEN: POSITIVE
DAT, IGG: NEGATIVE
Fetal Screen: NEGATIVE
GESTATIONAL AGE(WKS): 39.5
Unit division: 0

## 2014-03-12 NOTE — Discharge Summary (Signed)
  Obstetric Discharge Summary Reason for Admission: [facility] Prenatal Procedures: none Intrapartum Procedures: spontaneous vaginal delivery Postpartum Procedures: none Complications-Operative and Postpartum: none  Delivery Note At 11:09 AM a viable female was delivered via Vaginal, Spontaneous Delivery (Presentation: ;  ).  APGAR: 9, 9; weight 6 lb 14.9 oz (3145 g).   Placenta status: Intact, Spontaneous.  Cord: 3 vessels with the following complications: None.  Anesthesia: None  Episiotomy: None Lacerations: None Suture Repair: n/a Est. Blood Loss (mL):   Mom to postpartum.  Baby to Couplet care / Skin to Skin. After [facility]  Sonia Gibson Sonia Gibson 03/12/2014, 8:15 AM     Hospital Course:  Active Problems:   Spontaneous vaginal delivery   Today: No acute events overnight.  Pt denies problems with ambulating, voiding or po intake.  She denies nausea or vomiting.  Pain is well controlled.  Plan for birth control is  Depo-Provera.  Method of Feeding: breast  Sonia HartKiarra S Gibson is a 23 y.o. G2P1011 s/p S[facility], driven there by family, delivery by ER doc prior to midwife arriving and subsequently [facility].  She has postpartum course that was uncomplicated including no problems with ambulating, PO intake, urination, pain, or bleeding. The pt feels ready to go home and  will be discharged with outpatient follow-up.    H/H: Lab Results  Component Value Date/Time   HGB 11.7* 03/10/2014  3:25 PM   HCT 34.1* 03/10/2014  3:25 PM    Discharge Diagnoses: Term Pregnancy-delivered  Discharge Information: Date: 03/12/2014 Activity: pelvic rest Diet: routine  Medications: None Breast feeding:  Yes Condition: stable Instructions: refer to handout Discharge to: home   Discharge Instructions   Call MD for:  severe uncontrolled pain    Complete by:  As directed      Call MD for:  temperature >100.4    Complete  by:  As directed      Diet - low sodium heart healthy    Complete by:  As directed             Medication List         acyclovir 400 MG tablet  Commonly known as:  ZOVIRAX  Take 1 tablet (400 mg total) by mouth 3 (three) times daily. Start 9/14     betamethasone valerate 0.1 % cream  Commonly known as:  VALISONE  Apply 1 application topically daily as needed (for dry areas on face).     prenatal vitamin w/FE, FA 27-1 MG Tabs tablet  Take 1 tablet by mouth daily at 12 noon.           Follow-up Information   Follow up with [facility] In 6 weeks.   Contact information:   [address] [place] [phone]      Perry MountACOSTA,Sonia Gibson ,MD OB Fellow 03/12/2014,8:15 AM

## 2014-03-12 NOTE — Progress Notes (Signed)
Ur chart review completed.  

## 2014-03-12 NOTE — Discharge Instructions (Signed)

## 2014-03-15 NOTE — Discharge Summary (Signed)
Attestation of Attending Supervision of Advanced Practitioner (CNM/NP): Evaluation and management procedures were performed by the Advanced Practitioner under my supervision and collaboration.  I have reviewed the Advanced Practitioner's note and chart, and I agree with the management and plan.  Crandall Harvel 03/15/2014 10:23 AM   

## 2014-03-19 ENCOUNTER — Encounter (HOSPITAL_COMMUNITY): Payer: Self-pay | Admitting: *Deleted

## 2014-04-02 ENCOUNTER — Other Ambulatory Visit: Payer: Self-pay | Admitting: Women's Health

## 2014-04-16 ENCOUNTER — Encounter: Payer: Self-pay | Admitting: Women's Health

## 2014-04-16 ENCOUNTER — Ambulatory Visit (INDEPENDENT_AMBULATORY_CARE_PROVIDER_SITE_OTHER): Payer: No Typology Code available for payment source | Admitting: Women's Health

## 2014-04-16 NOTE — Patient Instructions (Signed)
NO SEX UNTIL AFTER YOUR NEXPLANON IS PUT IN  Etonogestrel implant- NEXPLANON What is this medicine? ETONOGESTREL (et oh noe JES trel) is a contraceptive (birth control) device. It is used to prevent pregnancy. It can be used for up to 3 years. This medicine may be used for other purposes; ask your health care provider or pharmacist if you have questions. COMMON BRAND NAME(S): Implanon, Nexplanon What should I tell my health care provider before I take this medicine? They need to know if you have any of these conditions: -abnormal vaginal bleeding -blood vessel disease or blood clots -cancer of the breast, cervix, or liver -depression -diabetes -gallbladder disease -headaches -heart disease or recent heart attack -high blood pressure -high cholesterol -kidney disease -liver disease -renal disease -seizures -tobacco smoker -an unusual or allergic reaction to etonogestrel, other hormones, anesthetics or antiseptics, medicines, foods, dyes, or preservatives -pregnant or trying to get pregnant -breast-feeding How should I use this medicine? This device is inserted just under the skin on the inner side of your upper arm by a health care professional. Talk to your pediatrician regarding the use of this medicine in children. Special care may be needed. Overdosage: If you think you've taken too much of this medicine contact a poison control center or emergency room at once. Overdosage: If you think you have taken too much of this medicine contact a poison control center or emergency room at once. NOTE: This medicine is only for you. Do not share this medicine with others. What if I miss a dose? This does not apply. What may interact with this medicine? Do not take this medicine with any of the following medications: -amprenavir -bosentan -fosamprenavir This medicine may also interact with the following medications: -barbiturate medicines for inducing sleep or treating  seizures -certain medicines for fungal infections like ketoconazole and itraconazole -griseofulvin -medicines to treat seizures like carbamazepine, felbamate, oxcarbazepine, phenytoin, topiramate -modafinil -phenylbutazone -rifampin -some medicines to treat HIV infection like atazanavir, indinavir, lopinavir, nelfinavir, tipranavir, ritonavir -St. John's wort This list may not describe all possible interactions. Give your health care provider a list of all the medicines, herbs, non-prescription drugs, or dietary supplements you use. Also tell them if you smoke, drink alcohol, or use illegal drugs. Some items may interact with your medicine. What should I watch for while using this medicine? This product does not protect you against HIV infection (AIDS) or other sexually transmitted diseases. You should be able to feel the implant by pressing your fingertips over the skin where it was inserted. Tell your doctor if you cannot feel the implant. What side effects may I notice from receiving this medicine? Side effects that you should report to your doctor or health care professional as soon as possible: -allergic reactions like skin rash, itching or hives, swelling of the face, lips, or tongue -breast lumps -changes in vision -confusion, trouble speaking or understanding -dark urine -depressed mood -general ill feeling or flu-like symptoms -light-colored stools -loss of appetite, nausea -right upper belly pain -severe headaches -severe pain, swelling, or tenderness in the abdomen -shortness of breath, chest pain, swelling in a leg -signs of pregnancy -sudden numbness or weakness of the face, arm or leg -trouble walking, dizziness, loss of balance or coordination -unusual vaginal bleeding, discharge -unusually weak or tired -yellowing of the eyes or skin Side effects that usually do not require medical attention (Report these to your doctor or health care professional if they continue or  are bothersome.): -acne -breast pain -changes in weight -  cough -fever or chills -headache -irregular menstrual bleeding -itching, burning, and vaginal discharge -pain or difficulty passing urine -sore throat This list may not describe all possible side effects. Call your doctor for medical advice about side effects. You may report side effects to FDA at 1-800-FDA-1088. Where should I keep my medicine? This drug is given in a hospital or clinic and will not be stored at home. NOTE: This sheet is a summary. It may not cover all possible information. If you have questions about this medicine, talk to your doctor, pharmacist, or health care provider.  2015, Elsevier/Gold Standard. (2011-11-09 15:37:45)

## 2014-04-16 NOTE — Progress Notes (Signed)
Patient ID: Essie HartKiarra S Zakarian, female   DOB: 10/18/1990, 23 y.o.   MRN: [id number] Subjective:    Essie HartKiarra S Bradsher is a 23 y.o. 652P1011 African American female who presents for a postpartum visit. She is 4 weeks postpartum following a spontaneous vaginal delivery [facility] 39.[facility] gestational weeks [facility]. Anesthesia: none. I have fully reviewed the prenatal and intrapartum course. Postpartum course has been uncomplicated. Baby's course has been uncomplicated. Baby is feeding by bottle. Bleeding on period now- started 1 week ago. Bowel function is normal. Bladder function is normal. Patient is not sexually active. Last sexual activity: prior to birth of baby. Contraception method is abstinence and desires nexplanon. Postpartum depression screening: negative. Score 7. Eating/sleeping/coping well, no SI/HI/II, has support at home.  Last pap 09/2013 and was neg.  The following portions of the patient's history were reviewed and updated as appropriate: allergies, current medications, past medical history, past surgical history and problem list.  Review of Systems Pertinent items are noted in HPI.   Filed Vitals:   04/16/14 0906  BP: 104/70  Height: 5\' 4"  (1.626 m)  Weight: 117 lb (53.071 kg)   No LMP recorded.  Objective:   General:  alert, cooperative and no distress   Breasts:  deferred, no complaints  Lungs: clear to auscultation bilaterally  Heart:  regular rate and rhythm  Abdomen: soft, nontender   Vulva: normal  Vagina: normal vagina, dark red menstrual blood  Cervix:  closed  Corpus: Well-involuted  Adnexa:  Non-palpable  Rectal Exam: No hemorrhoids        Assessment:   Postpartum exam 4 wks s/p SVB Bottlefeeding Depression screening Contraception counseling   Plan:   Contraception: order nexplanon today, abstinence until placement Follow up in: 3 weeks for nexplanon insertion or earlier if needed  Marge DuncansBooker, Abdias Hickam Randall CNM, WHNP-BC 04/16/2014 9:20 AM

## 2014-04-18 ENCOUNTER — Telehealth: Payer: Self-pay | Admitting: Obstetrics & Gynecology

## 2014-04-18 ENCOUNTER — Telehealth: Payer: Self-pay | Admitting: Women's Health

## 2014-04-18 NOTE — Telephone Encounter (Signed)
Pt states she had her tooth pulled yesterday and would like a note for not to return to work until next week. Informed pt since the work note was not related to her medical care with our office she would need to get a note from the dentist office that pulled her tooth to excuse her for her absentees. Pt verbalized understanding.

## 2014-04-18 NOTE — Telephone Encounter (Signed)
Pt informed per Joellyn HaffKim Booker, CNM she would need to get a work note from the dentist office that pulled her tooth to return to work on Monday. Pt verbalized understanding.

## 2014-04-23 ENCOUNTER — Ambulatory Visit: Payer: No Typology Code available for payment source | Admitting: Women's Health

## 2014-05-03 ENCOUNTER — Ambulatory Visit: Payer: No Typology Code available for payment source | Admitting: Adult Health

## 2014-05-03 ENCOUNTER — Encounter: Payer: Self-pay | Admitting: Adult Health

## 2014-05-03 ENCOUNTER — Ambulatory Visit (INDEPENDENT_AMBULATORY_CARE_PROVIDER_SITE_OTHER): Payer: No Typology Code available for payment source | Admitting: Adult Health

## 2014-05-03 VITALS — BP 100/74 | Ht 64.0 in | Wt 115.0 lb

## 2014-05-03 DIAGNOSIS — N760 Acute vaginitis: Secondary | ICD-10-CM

## 2014-05-03 DIAGNOSIS — B9689 Other specified bacterial agents as the cause of diseases classified elsewhere: Secondary | ICD-10-CM

## 2014-05-03 DIAGNOSIS — N898 Other specified noninflammatory disorders of vagina: Secondary | ICD-10-CM | POA: Insufficient documentation

## 2014-05-03 DIAGNOSIS — Z113 Encounter for screening for infections with a predominantly sexual mode of transmission: Secondary | ICD-10-CM

## 2014-05-03 DIAGNOSIS — A499 Bacterial infection, unspecified: Secondary | ICD-10-CM

## 2014-05-03 HISTORY — DX: Other specified noninflammatory disorders of vagina: N89.8

## 2014-05-03 HISTORY — DX: Other specified bacterial agents as the cause of diseases classified elsewhere: B96.89

## 2014-05-03 HISTORY — DX: Other specified bacterial agents as the cause of diseases classified elsewhere: N76.0

## 2014-05-03 LAB — POCT WET PREP (WET MOUNT): WBC WET PREP: POSITIVE

## 2014-05-03 MED ORDER — METRONIDAZOLE 500 MG PO TABS: 500.0000 mg | ORAL_TABLET | Freq: Two times a day (BID) | ORAL | Status: DC

## 2014-05-03 NOTE — Progress Notes (Signed)
Subjective:     Patient ID: Sonia Gibson, female   DOB: 09/10/1990, 23 y.o.   MRN: [id number]  HPI Sonia Gibson is a 23 year old black female in for vaginal discharge with odor, and wants STD testing.  Review of Systems See HPI Reviewed past medical,surgical, social and family history. Reviewed medications and allergies.     Objective:   Physical Exam BP 100/74 mmHg  Ht 5\' 4"  (1.626 m)  Wt 115 lb (52.164 kg)  BMI 19.73 kg/m2  LMP 04/04/2014  Breastfeeding? No Skin warm and dry.Pelvic: external genitalia is normal in appearance, vagina: white discharge with odor, cervix:smooth and bulbous, uterus: normal size, shape and contour, non tender, no masses felt, adnexa: no masses or tenderness noted. Wet prep: + for clue cells and +WBCs. GC/CHL obtained.     Assessment:     Vaginal discharge BV STD screening    Plan:     Rx flagyl 500 mg 1 bid x 7 days, no alcohol, review handout on BV   Check HIV,RPR, HSV2 and GC/CHL Follow up prn

## 2014-05-03 NOTE — Patient Instructions (Signed)
Bacterial Vaginosis Bacterial vaginosis is a vaginal infection that occurs when the normal balance of bacteria in the vagina is disrupted. It results from an overgrowth of certain bacteria. This is the most common vaginal infection in women of childbearing age. Treatment is important to prevent complications, especially in pregnant women, as it can cause a premature delivery. CAUSES  Bacterial vaginosis is caused by an increase in harmful bacteria that are normally present in smaller amounts in the vagina. Several different kinds of bacteria can cause bacterial vaginosis. However, the reason that the condition develops is not fully understood. RISK FACTORS Certain activities or behaviors can put you at an increased risk of developing bacterial vaginosis, including:  Having a new sex partner or multiple sex partners.  Douching.  Using an intrauterine device (IUD) for contraception. Women do not get bacterial vaginosis from toilet seats, bedding, swimming pools, or contact with objects around them. SIGNS AND SYMPTOMS  Some women with bacterial vaginosis have no signs or symptoms. Common symptoms include:  Grey vaginal discharge.  A fishlike odor with discharge, especially after sexual intercourse.  Itching or burning of the vagina and vulva.  Burning or pain with urination. DIAGNOSIS  Your health care provider will take a medical history and examine the vagina for signs of bacterial vaginosis. A sample of vaginal fluid may be taken. Your health care provider will look at this sample under a microscope to check for bacteria and abnormal cells. A vaginal pH test may also be done.  TREATMENT  Bacterial vaginosis may be treated with antibiotic medicines. These may be given in the form of a pill or a vaginal cream. A second round of antibiotics may be prescribed if the condition comes back after treatment.  HOME CARE INSTRUCTIONS   Only take over-the-counter or prescription medicines as  directed by your health care provider.  If antibiotic medicine was prescribed, take it as directed. Make sure you finish it even if you start to feel better.  Do not have sex until treatment is completed.  Tell all sexual partners that you have a vaginal infection. They should see their health care provider and be treated if they have problems, such as a mild rash or itching.  Practice safe sex by using condoms and only having one sex partner. SEEK MEDICAL CARE IF:   Your symptoms are not improving after 3 days of treatment.  You have increased discharge or pain.  You have a fever. MAKE SURE YOU:   Understand these instructions.  Will watch your condition.  Will get help right away if you are not doing well or get worse. FOR MORE INFORMATION  Centers for Disease Control and Prevention, Division of STD Prevention: SolutionApps.co.zawww.cdc.gov/std American Sexual Health Association (ASHA): www.ashastd.org  Document Released: 05/04/2005 Document Revised: 02/22/2013 Document Reviewed: 12/14/2012 [facility] Patient Information 2015 [employer], [place]. This information is not intended to replace advice given to you by your health care provider. Make sure you discuss any questions you have with your health care provider. No alcohol with flagyl Follow up prn

## 2014-05-04 LAB — HSV 2 ANTIBODY, IGG: HSV 2 Glycoprotein G Ab, IgG: 2.05 IV — ABNORMAL HIGH

## 2014-05-04 LAB — GC/CHLAMYDIA PROBE AMP
CT PROBE, AMP APTIMA: NEGATIVE
GC Probe RNA: NEGATIVE

## 2014-05-04 LAB — RPR

## 2014-05-04 LAB — HIV ANTIBODY (ROUTINE TESTING W REFLEX): HIV: NONREACTIVE

## 2014-05-07 ENCOUNTER — Encounter: Payer: No Typology Code available for payment source | Admitting: Women's Health

## 2014-05-07 ENCOUNTER — Telehealth: Payer: Self-pay | Admitting: Adult Health

## 2014-05-07 NOTE — Telephone Encounter (Signed)
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

## 2014-05-07 NOTE — Telephone Encounter (Signed)
Pt aware of labs and +HSV2 use condoms

## 2014-05-21 ENCOUNTER — Ambulatory Visit (INDEPENDENT_AMBULATORY_CARE_PROVIDER_SITE_OTHER): Payer: No Typology Code available for payment source | Admitting: Women's Health

## 2014-05-21 ENCOUNTER — Encounter: Payer: Self-pay | Admitting: Women's Health

## 2014-05-21 VITALS — BP 120/82 | Ht 64.0 in | Wt 114.5 lb

## 2014-05-21 DIAGNOSIS — Z3049 Encounter for surveillance of other contraceptives: Secondary | ICD-10-CM

## 2014-05-21 DIAGNOSIS — Z30017 Encounter for initial prescription of implantable subdermal contraceptive: Secondary | ICD-10-CM

## 2014-05-21 DIAGNOSIS — Z3202 Encounter for pregnancy test, result negative: Secondary | ICD-10-CM

## 2014-05-21 LAB — POCT URINE PREGNANCY: PREG TEST UR: NEGATIVE

## 2014-05-21 NOTE — Progress Notes (Signed)
Patient ID: Sonia Gibson, female   DOB: 1990/08/18, 24 y.o.   MRN: [id number] Sonia Gibson is a 24 y.o. year old African American female here for Nexplanon insertion.  Patient's last menstrual period was 05/08/2014., last sexual intercourse was 05/05/14, and her pregnancy test today was negative.  Risks/benefits/side effects of Nexplanon have been discussed and her questions have been answered.  Specifically, a failure rate of 05/998 has been reported, with an increased failure rate if pt takes St. John's Wort and/or antiseizure medicaitons.  Sonia Gibson is aware of the common side effect of irregular bleeding, which the incidence of decreases over time.  BP 120/82 mmHg  Ht  (1.626 m)  Wt 114 lb 8 oz (51.937 kg)  BMI 19.64 kg/m2  LMP 05/08/2014  Breastfeeding? No  Results for orders placed or performed in visit on 05/21/14 (from the past 24 hour(s))  POCT urine pregnancy   Collection Time: 05/21/14  3:32 PM  Result Value Ref Range   Preg Test, Ur Negative      She is right-handed, so her left arm, approximately 4 inches proximal from the elbow, was cleansed with alcohol and anesthetized with 2cc of 2% Lidocaine.  The area was cleansed again with betadine and the Nexplanon was inserted per manufacturer's recommendations without difficulty.  A steri-strip and pressure bandage were applied.  Pt was instructed to keep the area clean and dry, remove pressure bandage in 24 hours, and keep insertion site covered with the steri-strip for 3-5 days.  Back up contraception was recommended for 2 weeks.  She was given a card indicating date Nexplanon was inserted and date it needs to be removed. Follow-up PRN problems and in 6 months for physical.   Marge Duncans CNM, [facility] 05/21/2014 4:06 PM

## 2014-05-21 NOTE — Patient Instructions (Signed)
Keep the area clean and dry.  You can remove the big bandage in 24 hours, and the small steri-strip bandage in 3-5 days.  A back up method, such as condoms, should be used for two weeks. You may have irregular vaginal bleeding for the first 6 months after the Nexplanon is placed, then the bleeding usually lightens and it is possible that you may not have any periods.  If you have any concerns, please give us a call.   Etonogestrel implant- Nexplanon What is this medicine? ETONOGESTREL (et oh noe JES trel) is a contraceptive (birth control) device. It is used to prevent pregnancy. It can be used for up to 3 years. This medicine may be used for other purposes; ask your health care provider or pharmacist if you have questions. COMMON BRAND NAME(S): Implanon, Nexplanon What should I tell my health care provider before I take this medicine? They need to know if you have any of these conditions: -abnormal vaginal bleeding -blood vessel disease or blood clots -cancer of the breast, cervix, or liver -depression -diabetes -gallbladder disease -headaches -heart disease or recent heart attack -high blood pressure -high cholesterol -kidney disease -liver disease -renal disease -seizures -tobacco smoker -an unusual or allergic reaction to etonogestrel, other hormones, anesthetics or antiseptics, medicines, foods, dyes, or preservatives -pregnant or trying to get pregnant -breast-feeding How should I use this medicine? This device is inserted just under the skin on the inner side of your upper arm by a health care professional. Talk to your pediatrician regarding the use of this medicine in children. Special care may be needed. Overdosage: If you think you've taken too much of this medicine contact a poison control center or emergency room at once. Overdosage: If you think you have taken too much of this medicine contact a poison control center or emergency room at once. NOTE: This medicine is only  for you. Do not share this medicine with others. What if I miss a dose? This does not apply. What may interact with this medicine? Do not take this medicine with any of the following medications: -amprenavir -bosentan -fosamprenavir This medicine may also interact with the following medications: -barbiturate medicines for inducing sleep or treating seizures -certain medicines for fungal infections like ketoconazole and itraconazole -griseofulvin -medicines to treat seizures like carbamazepine, felbamate, oxcarbazepine, phenytoin, topiramate -modafinil -phenylbutazone -rifampin -some medicines to treat HIV infection like atazanavir, indinavir, lopinavir, nelfinavir, tipranavir, ritonavir -St. John's wort This list may not describe all possible interactions. Give your health care provider a list of all the medicines, herbs, non-prescription drugs, or dietary supplements you use. Also tell them if you smoke, drink alcohol, or use illegal drugs. Some items may interact with your medicine. What should I watch for while using this medicine? This product does not protect you against HIV infection (AIDS) or other sexually transmitted diseases. You should be able to feel the implant by pressing your fingertips over the skin where it was inserted. Tell your doctor if you cannot feel the implant. What side effects may I notice from receiving this medicine? Side effects that you should report to your doctor or health care professional as soon as possible: -allergic reactions like skin rash, itching or hives, swelling of the face, lips, or tongue -breast lumps -changes in vision -confusion, trouble speaking or understanding -dark urine -depressed mood -general ill feeling or flu-like symptoms -light-colored stools -loss of appetite, nausea -right upper belly pain -severe headaches -severe pain, swelling, or tenderness in the abdomen -shortness of   breath, chest pain, swelling in a leg -signs  of pregnancy -sudden numbness or weakness of the face, arm or leg -trouble walking, dizziness, loss of balance or coordination -unusual vaginal bleeding, discharge -unusually weak or tired -yellowing of the eyes or skin Side effects that usually do not require medical attention (Report these to your doctor or health care professional if they continue or are bothersome.): -acne -breast pain -changes in weight -cough -fever or chills -headache -irregular menstrual bleeding -itching, burning, and vaginal discharge -pain or difficulty passing urine -sore throat This list may not describe all possible side effects. Call your doctor for medical advice about side effects. You may report side effects to FDA at 1-800-FDA-1088. Where should I keep my medicine? This drug is given in a hospital or clinic and will not be stored at home. NOTE: This sheet is a summary. It may not cover all possible information. If you have questions about this medicine, talk to your doctor, pharmacist, or health care provider.  2015, Elsevier/Gold Standard. (2011-11-09 15:37:45)  

## 2014-05-22 ENCOUNTER — Other Ambulatory Visit: Payer: No Typology Code available for payment source | Admitting: Adult Health

## 2014-05-27 ENCOUNTER — Emergency Department (HOSPITAL_COMMUNITY): Payer: No Typology Code available for payment source

## 2014-05-27 ENCOUNTER — Encounter (HOSPITAL_COMMUNITY): Payer: Self-pay | Admitting: Emergency Medicine

## 2014-05-27 ENCOUNTER — Emergency Department (HOSPITAL_COMMUNITY)
Admission: EM | Admit: 2014-05-27 | Discharge: 2014-05-27 | Disposition: A | Discharge status: Home / Self Care | Payer: No Typology Code available for payment source | Attending: Emergency Medicine | Admitting: Emergency Medicine

## 2014-05-27 DIAGNOSIS — Z87448 Personal history of other diseases of urinary system: Secondary | ICD-10-CM | POA: Diagnosis not present

## 2014-05-27 DIAGNOSIS — S300XXA Contusion of lower back and pelvis, initial encounter: Secondary | ICD-10-CM | POA: Diagnosis not present

## 2014-05-27 DIAGNOSIS — Z8619 Personal history of other infectious and parasitic diseases: Secondary | ICD-10-CM | POA: Diagnosis not present

## 2014-05-27 DIAGNOSIS — Y92410 Unspecified street and highway as the place of occurrence of the external cause: Secondary | ICD-10-CM | POA: Diagnosis not present

## 2014-05-27 DIAGNOSIS — Y998 Other external cause status: Secondary | ICD-10-CM | POA: Diagnosis not present

## 2014-05-27 DIAGNOSIS — Z3202 Encounter for pregnancy test, result negative: Secondary | ICD-10-CM | POA: Diagnosis not present

## 2014-05-27 DIAGNOSIS — Y9389 Activity, other specified: Secondary | ICD-10-CM | POA: Diagnosis not present

## 2014-05-27 DIAGNOSIS — S3992XA Unspecified injury of lower back, initial encounter: Secondary | ICD-10-CM | POA: Diagnosis present

## 2014-05-27 LAB — POC URINE PREG, ED: Preg Test, Ur: NEGATIVE

## 2014-05-27 MED ORDER — NAPROXEN 500 MG PO TABS: 500.0000 mg | ORAL_TABLET | Freq: Two times a day (BID) | ORAL | Status: DC

## 2014-05-27 MED ORDER — CYCLOBENZAPRINE HCL 5 MG PO TABS: 5.0000 mg | ORAL_TABLET | Freq: Three times a day (TID) | ORAL | Status: DC | PRN
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
## 2014-05-27 NOTE — Discharge Instructions (Signed)
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
## 2014-05-28 ENCOUNTER — Other Ambulatory Visit: Payer: Self-pay | Admitting: Women's Health

## 2014-07-15 ENCOUNTER — Other Ambulatory Visit: Payer: Self-pay | Admitting: Women's Health

## 2014-07-20 ENCOUNTER — Telehealth: Payer: Self-pay | Admitting: Obstetrics & Gynecology

## 2014-07-20 NOTE — Telephone Encounter (Signed)
Pt requesting refill on the Betamethasone 0.1 % cream.

## 2014-09-10 ENCOUNTER — Other Ambulatory Visit: Payer: Self-pay | Admitting: Women's Health

## 2014-09-11 ENCOUNTER — Other Ambulatory Visit: Payer: Self-pay | Admitting: Women's Health

## 2015-03-28 ENCOUNTER — Encounter (HOSPITAL_COMMUNITY): Payer: Self-pay | Admitting: Emergency Medicine

## 2015-03-28 ENCOUNTER — Emergency Department (INDEPENDENT_AMBULATORY_CARE_PROVIDER_SITE_OTHER)
Admission: EM | Admit: 2015-03-28 | Discharge: 2015-03-28 | Disposition: A | Discharge status: Home / Self Care | Payer: Managed Care, Other (non HMO) | Source: Home / Self Care | Attending: Family Medicine | Admitting: Family Medicine

## 2015-03-28 ENCOUNTER — Other Ambulatory Visit (HOSPITAL_COMMUNITY)
Admission: RE | Admit: 2015-03-28 | Discharge: 2015-03-28 | Disposition: A | Discharge status: Home / Self Care | Payer: Managed Care, Other (non HMO) | Source: Ambulatory Visit | Attending: Family Medicine | Admitting: Family Medicine

## 2015-03-28 DIAGNOSIS — Z113 Encounter for screening for infections with a predominantly sexual mode of transmission: Secondary | ICD-10-CM | POA: Insufficient documentation

## 2015-03-28 DIAGNOSIS — A499 Bacterial infection, unspecified: Secondary | ICD-10-CM

## 2015-03-28 DIAGNOSIS — N760 Acute vaginitis: Secondary | ICD-10-CM

## 2015-03-28 DIAGNOSIS — B9689 Other specified bacterial agents as the cause of diseases classified elsewhere: Secondary | ICD-10-CM

## 2015-03-28 LAB — POCT PREGNANCY, URINE
Preg Test, Ur: NEGATIVE
Preg Test, Ur: NEGATIVE

## 2015-03-28 MED ORDER — METRONIDAZOLE 500 MG PO TABS: 500.0000 mg | ORAL_TABLET | Freq: Two times a day (BID) | ORAL | Status: DC

## 2015-03-28 NOTE — Discharge Instructions (Signed)
Take the Flagyl twice daily for the next 10 days.  We'll call if the other tests show anything else.    It was good to meet you today.     Bacterial Vaginosis Bacterial vaginosis is a vaginal infection that occurs when the normal balance of bacteria in the vagina is disrupted. It results from an overgrowth of certain bacteria. This is the most common vaginal infection in women of childbearing age. Treatment is important to prevent complications, especially in pregnant women, as it can cause a premature delivery. CAUSES  Bacterial vaginosis is caused by an increase in harmful bacteria that are normally present in smaller amounts in the vagina. Several different kinds of bacteria can cause bacterial vaginosis. However, the reason that the condition develops is not fully understood. RISK FACTORS Certain activities or behaviors can put you at an increased risk of developing bacterial vaginosis, including:  Having a new sex partner or multiple sex partners.  Douching.  Using an intrauterine device (IUD) for contraception. Women do not get bacterial vaginosis from toilet seats, bedding, swimming pools, or contact with objects around them. SIGNS AND SYMPTOMS  Some women with bacterial vaginosis have no signs or symptoms. Common symptoms include:  Grey vaginal discharge.  A fishlike odor with discharge, especially after sexual intercourse.  Itching or burning of the vagina and vulva.  Burning or pain with urination. DIAGNOSIS  Your health care provider will take a medical history and examine the vagina for signs of bacterial vaginosis. A sample of vaginal fluid may be taken. Your health care provider will look at this sample under a microscope to check for bacteria and abnormal cells. A vaginal pH test may also be done.  TREATMENT  Bacterial vaginosis may be treated with antibiotic medicines. These may be given in the form of a pill or a vaginal cream. A second round of antibiotics may be  prescribed if the condition comes back after treatment. Because bacterial vaginosis increases your risk for sexually transmitted diseases, getting treated can help reduce your risk for chlamydia, gonorrhea, HIV, and herpes. HOME CARE INSTRUCTIONS   Only take over-the-counter or prescription medicines as directed by your health care provider.  If antibiotic medicine was prescribed, take it as directed. Make sure you finish it even if you start to feel better.  Tell all sexual partners that you have a vaginal infection. They should see their health care provider and be treated if they have problems, such as a mild rash or itching.  During treatment, it is important that you follow these instructions:  Avoid sexual activity or use condoms correctly.  Do not douche.  Avoid alcohol as directed by your health care provider.  Avoid breastfeeding as directed by your health care provider. SEEK MEDICAL CARE IF:   Your symptoms are not improving after 3 days of treatment.  You have increased discharge or pain.  You have a fever. MAKE SURE YOU:   Understand these instructions.  Will watch your condition.  Will get help right away if you are not doing well or get worse. FOR MORE INFORMATION  Centers for Disease Control and Prevention, Division of STD Prevention: SolutionApps.co.zawww.cdc.gov/std American Sexual Health Association (ASHA): www.ashastd.org    This information is not intended to replace advice given to you by your health care provider. Make sure you discuss any questions you have with your health care provider.   Document Released: 05/04/2005 Document Revised: 05/25/2014 Document Reviewed: 12/14/2012 Elsevier Interactive Patient Education [employer]2016 Elsevier Inc.

## 2015-03-28 NOTE — ED Provider Notes (Signed)
CSN: [id number]     Arrival date & time 03/28/15  1734 History   First MD Initiated Contact with Patient 03/28/15 1810     Chief Complaint  Patient presents with  . Bacterial Vaginosis    (Consider location/radiation/quality/duration/timing/severity/associated sxs/prior Treatment) HPI 24 yo F with known recurrent BV since placement of Nexplanon in January of this year.  Has had ongoing bleeding as well since placement of Nexplanon.  BV has occurred every few weeks as well.  Known history of genital herpes.   She is having d/c with odor.  No itching.  No abdominal pain.  No N/V/fevers.    Past Medical History  Diagnosis Date  . Medical history non-contributory   . Vaginal discharge 05/03/2014  . BV (bacterial vaginosis) 05/03/2014   Past Surgical History  Procedure Laterality Date  . No past surgeries     History reviewed. No pertinent family history. Social History  Substance Use Topics  . Smoking status: Never Smoker   . Smokeless tobacco: Never Used  . Alcohol Use: No   OB History    Gravida Para Term Preterm AB TAB SAB Ectopic Multiple Living   2 1 1  1 1    1      Review of Systems  See HPI  Allergies  Review of patient's allergies indicates no known allergies.  Home Medications   Prior to Admission medications   Medication Sig Start Date End Date Taking? Authorizing Provider  betamethasone valerate (VALISONE) 0.1 % cream APPLY VERY SMALL AMOUNT OF CREAM DAILY AS NEEDED 07/23/14   Cheral MarkerKimberly R Booker, CNM  cyclobenzaprine (FLEXERIL) 5 MG tablet Take 1 tablet (5 mg total) by mouth 3 (three) times daily as needed for muscle spasms. 05/27/14   Tammy Triplett, PA-C  metroNIDAZOLE (FLAGYL) 500 MG tablet Take 1 tablet (500 mg total) by mouth 2 (two) times daily. X 5 days 03/28/15   Tobey GrimJeffrey H Aoki Wedemeyer, MD  naproxen (NAPROSYN) 500 MG tablet Take 1 tablet (500 mg total) by mouth 2 (two) times daily with a meal. 05/27/14   Tammy Triplett, PA-C  prenatal vitamin w/FE, FA (PRENATAL 1 +  1) 27-1 MG TABS tablet Take 1 tablet by mouth daily at 12 noon. Patient not taking: Reported on 05/21/2014 11/23/13   Adline PotterJennifer A Griffin, NP   Meds Ordered and Administered this Visit  Medications - No data to display  BP 119/72 mmHg  Pulse 72  Temp(Src) 98.9 F (37.2 C) (Oral)  SpO2 100%  LMP 03/28/2015 (Exact Date) No data found.   Physical Exam  Gen:  Alert, cooperative patient who appears stated age in no acute distress.  Vital signs reviewed. Abd:  Soft/nondistended/nontender.  Good bowel sounds throughout all four quadrants.  No masses noted.  GYN:  External genitalia within normal limits.  Vaginal mucosa pink, moist, normal rugae.  Nonfriable cervix without lesions, grayish discharge and bleeding noted on speculum exam.     ED Course  Procedures (including critical care time)  Labs Review Labs Reviewed  POCT PREGNANCY, URINE  POCT PREGNANCY, URINE  CERVICOVAGINAL ANCILLARY ONLY    Imaging Review No results found.    MDM   1. BV (bacterial vaginosis)    Treat with Flagyl.   FU with OB-gyn for recurrent BV and discussion about bleeding with Nexplanon.     Tobey GrimJeffrey H Maurice Ramseur, MD 03/28/15 [phone]

## 2015-03-28 NOTE — ED Notes (Signed)
Pt has had recurrent BV since getting an birth control implant in January.  She states she gets a period every other week and has been getting BV about every other month.  Pt has a history of Herpes, but denies an outbreak at this time.  She is having d/c with odor but no other issues.

## 2015-03-29 LAB — CERVICOVAGINAL ANCILLARY ONLY
CHLAMYDIA, DNA PROBE: NEGATIVE
Neisseria Gonorrhea: NEGATIVE

## 2015-04-01 LAB — CERVICOVAGINAL ANCILLARY ONLY: WET PREP (BD AFFIRM): POSITIVE — AB

## 2015-04-01 NOTE — ED Notes (Addendum)
Vaginal swab testing positive for gardnerella , treatment adequate w Rx provided day of visit

## 2015-04-04 ENCOUNTER — Encounter: Payer: Self-pay | Admitting: Women's Health

## 2015-04-04 ENCOUNTER — Ambulatory Visit (INDEPENDENT_AMBULATORY_CARE_PROVIDER_SITE_OTHER): Payer: Managed Care, Other (non HMO) | Admitting: Women's Health

## 2015-04-04 VITALS — BP 100/60 | HR 76 | Wt 107.0 lb

## 2015-04-04 DIAGNOSIS — Z3046 Encounter for surveillance of implantable subdermal contraceptive: Secondary | ICD-10-CM | POA: Diagnosis not present

## 2015-04-04 DIAGNOSIS — Z30018 Encounter for initial prescription of other contraceptives: Secondary | ICD-10-CM | POA: Diagnosis not present

## 2015-04-04 MED ORDER — NORELGESTROMIN-ETH ESTRADIOL 150-35 MCG/24HR TD PTWK
MEDICATED_PATCH | TRANSDERMAL | Status: DC
Start: 2015-04-04 — End: 2017-11-01

## 2015-04-04 NOTE — Patient Instructions (Signed)
Keep the area clean and dry.  You can remove the big bandage in 24 hours, and the small steri-strip bandage in 3-5 days.  A back up method, such as condoms, should be used for two weeks.   Ethinyl Estradiol; Norelgestromin skin patches What is this medicine? ETHINYL ESTRADIOL;NORELGESTROMIN (ETH in il es tra DYE ole; nor el JES troe min) skin patch is used as a contraceptive (birth control method). This medicine combines two types of female hormones, an estrogen and a progestin. This patch is used to prevent ovulation and pregnancy. This medicine may be used for other purposes; ask your health care provider or pharmacist if you have questions. What should I tell my health care provider before I take this medicine? They need to know if you have or ever had any of these conditions: -abnormal vaginal bleeding -blood vessel disease or blood clots -breast, cervical, endometrial, ovarian, liver, or uterine cancer -diabetes -gallbladder disease -heart disease or recent heart attack -high blood pressure -high cholesterol -kidney disease -liver disease -migraine headaches -stroke -systemic lupus erythematosus (SLE) -tobacco smoker -an unusual or allergic reaction to estrogens, progestins, other medicines, foods, dyes, or preservatives -pregnant or trying to get pregnant -breast-feeding How should I use this medicine? This patch is applied to the skin. Follow the directions on the prescription label. Apply to clean, dry, healthy skin on the buttock, abdomen, upper outer arm or upper torso, in a place where it will not be rubbed by tight clothing. Do not use lotions or other cosmetics on the site where the patch will go. Press the patch firmly in place for 10 seconds to ensure good contact with the skin. Change the patch every 7 days on the same day of the week for 3 weeks. You will then have a break from the patch for 1 week, after which you will apply a new patch. Do not use your medicine more often  than directed. Contact your pediatrician regarding the use of this medicine in children. Special care may be needed. This medicine has been used in female children who have started having menstrual periods. A patient package insert for the product will be given with each prescription and refill. Read this sheet carefully each time. The sheet may change frequently. Overdosage: If you think you have taken too much of this medicine contact a poison control center or emergency room at once. NOTE: This medicine is only for you. Do not share this medicine with others. What if I miss a dose? You will need to replace your patch once a week as directed. If your patch is lost or falls off, contact your health care professional for advice. You may need to use another form of birth control if your patch has been off for more than 1 day. What may interact with this medicine? -acetaminophen -antibiotics or medicines for infections, especially rifampin, rifabutin, rifapentine, and griseofulvin, and possibly penicillins or tetracyclines -aprepitant -ascorbic acid (vitamin C) -atorvastatin -barbiturate medicines, such as phenobarbital -bosentan -carbamazepine -caffeine -clofibrate -cyclosporine -dantrolene -doxercalciferol -felbamate -grapefruit juice -hydrocortisone -medicines for anxiety or sleeping problems, such as diazepam or temazepam -medicines for diabetes, including pioglitazone -modafinil -mycophenolate -nefazodone -oxcarbazepine -phenytoin -prednisolone -ritonavir or other medicines for HIV infection or AIDS -rosuvastatin -selegiline -soy isoflavones supplements -St. John's wort -tamoxifen or raloxifene -theophylline -thyroid hormones -topiramate -warfarin This list may not describe all possible interactions. Give your health care provider a list of all the medicines, herbs, non-prescription drugs, or dietary supplements you use. Also tell them if  you smoke, drink alcohol, or use  illegal drugs. Some items may interact with your medicine. What should I watch for while using this medicine? Visit your doctor or health care professional for regular checks on your progress. You will need a regular breast and pelvic exam and Pap smear while on this medicine. Use an additional method of contraception during the first cycle that you use this patch. If you have any reason to think you are pregnant, stop using this medicine right away and contact your doctor or health care professional. If you are using this medicine for hormone related problems, it may take several cycles of use to see improvement in your condition. Smoking increases the risk of getting a blood clot or having a stroke while you are using hormonal birth control, especially if you are more than 25 years old. You are strongly advised not to smoke. This medicine can make your body retain fluid, making your fingers, hands, or ankles swell. Your blood pressure can go up. Contact your doctor or health care professional if you feel you are retaining fluid. This medicine can make you more sensitive to the sun. Keep out of the sun. If you cannot avoid being in the sun, wear protective clothing and use sunscreen. Do not use sun lamps or tanning beds/booths. If you wear contact lenses and notice visual changes, or if the lenses begin to feel uncomfortable, consult your eye care specialist. In some women, tenderness, swelling, or minor bleeding of the gums may occur. Notify your dentist if this happens. Brushing and flossing your teeth regularly may help limit this. See your dentist regularly and inform your dentist of the medicines you are taking. If you are going to have elective surgery or a MRI, you may need to stop using this medicine before the surgery or MRI. Consult your health care professional for advice. This medicine does not protect you against HIV infection (AIDS) or any other sexually transmitted diseases. What side  effects may I notice from receiving this medicine? Side effects that you should report to your doctor or health care professional as soon as possible: -breast tissue changes or discharge -changes in vaginal bleeding during your period or between your periods -chest pain -coughing up blood -dizziness or fainting spells -headaches or migraines -leg, arm or groin pain -severe or sudden headaches -stomach pain (severe) -sudden shortness of breath -sudden loss of coordination, especially on one side of the body -speech problems -symptoms of vaginal infection like itching, irritation or unusual discharge -tenderness in the upper abdomen -vomiting -weakness or numbness in the arms or legs, especially on one side of the body -yellowing of the eyes or skin Side effects that usually do not require medical attention (report to your doctor or health care professional if they continue or are bothersome): -breakthrough bleeding and spotting that continues beyond the 3 initial cycles of pills -breast tenderness -mood changes, anxiety, depression, frustration, anger, or emotional outbursts -increased sensitivity to sun or ultraviolet light -nausea -skin rash, acne, or brown spots on the skin -weight gain (slight) This list may not describe all possible side effects. Call your doctor for medical advice about side effects. You may report side effects to FDA at 1-800-FDA-1088. Where should I keep my medicine? Keep out of the reach of children. Store at room temperature between 15 and 30 degrees C (59 and 86 degrees F). Keep the patch in its pouch until time of use. Throw away any unused medicine after the expiration date. Dispose  of used patches properly. Since a used patch may still contain active hormones, fold the patch in half so that it sticks to itself prior to disposal. Throw away in a place where children or pets cannot reach. NOTE: This sheet is a summary. It may not cover all possible  information. If you have questions about this medicine, talk to your doctor, pharmacist, or health care provider.    2016, [employer]vier/Gold Standard. (2008-04-19 16:10:96)

## 2015-04-04 NOTE — Progress Notes (Signed)
Patient ID: Sonia Gibson, female   DOB: 08/15/1990, [age] y.o.   MRN: [id number] Sonia Gibson is a 24 y.o. year old African American female here for Nexplanon removal.  It was placed 05/21/14, and states she has had irregular bleeding since, hasn't tried megace, offered- but doesn't want to- just wants it out. Wants to try patches. Does not smoke, no h/o HTN, DVT/PE, CVA, MI, or migraines w/ aura. Patient given informed consent for removal of her Nexplanon.  BP 100/60 mmHg  Pulse 76  Wt 107 lb (48.535 kg)  LMP 03/28/2015 (Exact Date)  Appropriate time out taken. Nexplanon site identified.  Area prepped in usual sterile fashon. One cc of 2% lidocaine was used to anesthetize the area at the distal end of the implant. A small stab incision was made right beside the implant on the distal portion.  The Nexplanon rod was grasped using hemostats and removed without difficulty.  There was less than 3 cc blood loss. There were no complications.  Steri-strips were applied over the small incision and a pressure bandage was applied.  The patient tolerated the procedure well.  She was instructed to keep the area clean and dry, remove pressure bandage in 24 hours, and keep insertion site covered with the steri-strip for 3-5 days.  Condoms x 2wks  Follow-up 3 months for [facility] f/u  Marge DuncansBooker, Tenlee Wollin Randall CNM, [facility]NP[facility]BC 04/04/2015 4:15 PM
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
## 2015-07-29 ENCOUNTER — Emergency Department (HOSPITAL_COMMUNITY)
Admission: EM | Admit: 2015-07-29 | Discharge: 2015-07-29 | Disposition: A | Discharge status: Home / Self Care | Payer: Managed Care, Other (non HMO) | Attending: Emergency Medicine | Admitting: Emergency Medicine

## 2015-07-29 ENCOUNTER — Encounter (HOSPITAL_COMMUNITY): Payer: Self-pay | Admitting: *Deleted

## 2015-07-29 DIAGNOSIS — T192XXA Foreign body in vulva and vagina, initial encounter: Secondary | ICD-10-CM | POA: Diagnosis not present

## 2015-07-29 DIAGNOSIS — N760 Acute vaginitis: Secondary | ICD-10-CM | POA: Insufficient documentation

## 2015-07-29 DIAGNOSIS — R590 Localized enlarged lymph nodes: Secondary | ICD-10-CM | POA: Insufficient documentation

## 2015-07-29 DIAGNOSIS — X58XXXA Exposure to other specified factors, initial encounter: Secondary | ICD-10-CM | POA: Diagnosis not present

## 2015-07-29 DIAGNOSIS — Y999 Unspecified external cause status: Secondary | ICD-10-CM | POA: Insufficient documentation

## 2015-07-29 DIAGNOSIS — N898 Other specified noninflammatory disorders of vagina: Secondary | ICD-10-CM | POA: Diagnosis present

## 2015-07-29 DIAGNOSIS — Y929 Unspecified place or not applicable: Secondary | ICD-10-CM | POA: Diagnosis not present

## 2015-07-29 DIAGNOSIS — Y939 Activity, unspecified: Secondary | ICD-10-CM | POA: Insufficient documentation

## 2015-07-29 DIAGNOSIS — B9689 Other specified bacterial agents as the cause of diseases classified elsewhere: Secondary | ICD-10-CM

## 2015-07-29 LAB — WET PREP, GENITAL
SPERM: NONE SEEN
Trich, Wet Prep: NONE SEEN
YEAST WET PREP: NONE SEEN

## 2015-07-29 MED ORDER — METRONIDAZOLE 500 MG PO TABS: 500.0000 mg | ORAL_TABLET | Freq: Two times a day (BID) | ORAL | Status: DC

## 2015-07-29 NOTE — ED Provider Notes (Signed)
CSN: [id number]     Arrival date & time 07/29/15  2027 History   First MD Initiated Contact with Patient 07/29/15 2043     Chief Complaint  Patient presents with  . Vaginal Discharge     (Consider location/radiation/quality/duration/timing/severity/associated sxs/prior Treatment) Patient is a 25 y.o. female presenting with vaginal discharge. The history is provided by the patient.  Vaginal Discharge Quality:  Watery and malodorous Onset quality:  Gradual Duration:  4 days Timing:  Constant Progression:  Worsening Chronicity:  New Relieved by:  None tried Worsened by:  Nothing tried Ineffective treatments:  None tried Associated symptoms: no abdominal pain, no dyspareunia, no dysuria, no fever, no genital lesions and no nausea   Risk factors: new sexual partner and unprotected sex   Sonia Gibson is a 25 y.o. G2P1011, who is not pregnant here today with vaginal d/c that started 4 days ago. She had unprotected sex prior to the d/c starting. Current sex partner x 1 year.  Hx of HSV. She uses the patch for birth control.    Past Medical History  Diagnosis Date  . Medical history non-contributory   . Vaginal discharge 05/03/2014  . BV (bacterial vaginosis) 05/03/2014   Past Surgical History  Procedure Laterality Date  . No past surgeries     History reviewed. No pertinent family history. Social History  Substance Use Topics  . Smoking status: Never Smoker   . Smokeless tobacco: Never Used  . Alcohol Use: No   OB History    Gravida Para Term Preterm AB TAB SAB Ectopic Multiple Living   2 1 1  1 1    1      Review of Systems  Constitutional: Negative for fever and chills.  HENT: Negative.   Eyes: Negative for visual disturbance.  Cardiovascular: Negative for chest pain.  Gastrointestinal: Negative for nausea and abdominal pain.  Genitourinary: Positive for vaginal discharge. Negative for dysuria, urgency, frequency, hematuria, pelvic pain and dyspareunia.   Musculoskeletal: Negative for back pain and neck pain.  Skin: Negative for rash.  Neurological: Negative for syncope and headaches.  Psychiatric/Behavioral: Negative for confusion. The patient is not nervous/anxious.       Allergies  Review of patient's allergies indicates no known allergies.  Home Medications   Prior to Admission medications   Medication Sig Start Date End Date Taking? Authorizing Provider  betamethasone valerate (VALISONE) 0.1 % cream APPLY VERY SMALL AMOUNT OF CREAM DAILY AS NEEDED Patient not taking: Reported on 04/04/2015 07/23/14   Cheral MarkerKimberly R Booker, CNM  cyclobenzaprine (FLEXERIL) 5 MG tablet Take 1 tablet (5 mg total) by mouth 3 (three) times daily as needed for muscle spasms. Patient not taking: Reported on 04/04/2015 05/27/14   Tammy Triplett, PA-C  metroNIDAZOLE (FLAGYL) 500 MG tablet Take 1 tablet (500 mg total) by mouth 2 (two) times daily. 07/29/15   Parissa Chiao Orlene OchM Wanza Szumski, NP  naproxen (NAPROSYN) 500 MG tablet Take 1 tablet (500 mg total) by mouth 2 (two) times daily with a meal. Patient not taking: Reported on 04/04/2015 05/27/14   Tammy Triplett, PA-C  norelgestromin-ethinyl estradiol (ORTHO EVRA) 150-35 MCG/24HR transdermal patch Place one patch onto the skin once a week, then after the 3rd patch leave off for a week to have a period 04/04/15   Cheral MarkerKimberly R Booker, CNM  prenatal vitamin w/FE, FA (PRENATAL 1 + 1) 27-1 MG TABS tablet Take 1 tablet by mouth daily at 12 noon. Patient not taking: Reported on 05/21/2014 11/23/13   Adline PotterJennifer A Griffin,  NP   BP 108/71 mmHg  Pulse 73  Temp(Src) 98.3 F (36.8 C) (Oral)  Resp 20  Wt 49.442 kg  SpO2 100%  LMP 07/15/2015 Physical Exam  Constitutional: She is oriented to person, place, and time. She appears well-developed and well-nourished.  HENT:  Head: Normocephalic and atraumatic.  Eyes: Conjunctivae and EOM are normal.  Neck: Normal range of motion. Neck supple.  Cardiovascular: Normal rate.   Pulmonary/Chest: Effort  normal.  Abdominal: Soft. There is no tenderness.  Genitourinary:  External genitalia without lesions, foreign body vaginal vault. Blood saturated malodorous tampon removed from vaginal vault. Small blood vaginal vault, no CMT, no adnexal tenderness. Uterus without palpable enlargement.   Musculoskeletal: Normal range of motion.  Lymphadenopathy:       Right: Inguinal adenopathy present.  Neurological: She is alert and oriented to person, place, and time. No cranial nerve deficit.  Skin: Skin is warm and dry.  Psychiatric: She has a normal mood and affect. Her behavior is normal.  Nursing note and vitals reviewed.  Results for orders placed or performed during the hospital encounter of 07/29/15 (from the past 24 hour(s))  Wet prep, genital     Status: Abnormal   Collection Time: 07/29/15  9:53 PM  Result Value Ref Range   Yeast Wet Prep HPF POC NONE SEEN NONE SEEN   Trich, Wet Prep NONE SEEN NONE SEEN   Clue Cells Wet Prep HPF POC PRESENT (A) NONE SEEN   WBC, Wet Prep HPF POC FEW (A) NONE SEEN   Sperm NONE SEEN      ED Course  Procedures  MDM  25 y.o. female with vaginal d/c and odor stable for d/c without fever or pelvic pain. Will treat for BV. Discussed with the patient clinical and lab findings and plan of care. She will follow up with her GYN. She will return here as needed.   Final diagnoses:  Foreign body in vagina, initial encounter  Bacterial vaginal infection       Janne Napoleon, NP 07/29/15 2320  Samuel Jester, DO 08/02/15 1737

## 2015-07-29 NOTE — Discharge Instructions (Signed)

## 2015-07-29 NOTE — ED Notes (Signed)
Pt c/o vaginal discharge that started x 4 days ago; pt states she has been diagnosed with bacterial vaginosis in the past

## 2015-07-31 LAB — GC/CHLAMYDIA PROBE AMP (~~LOC~~) NOT AT ARMC
CHLAMYDIA, DNA PROBE: NEGATIVE
Neisseria Gonorrhea: NEGATIVE

## 2015-07-31 LAB — RPR: RPR: NONREACTIVE

## 2015-07-31 LAB — HIV ANTIBODY (ROUTINE TESTING W REFLEX): HIV Screen 4th Generation wRfx: NONREACTIVE

## 2015-11-11 ENCOUNTER — Other Ambulatory Visit: Payer: Managed Care, Other (non HMO) | Admitting: Obstetrics & Gynecology

## 2015-11-27 ENCOUNTER — Other Ambulatory Visit: Payer: Managed Care, Other (non HMO) | Admitting: Women's Health

## 2016-04-15 ENCOUNTER — Encounter: Payer: Self-pay | Admitting: Women's Health

## 2016-04-15 ENCOUNTER — Ambulatory Visit (INDEPENDENT_AMBULATORY_CARE_PROVIDER_SITE_OTHER): Payer: Managed Care, Other (non HMO) | Admitting: Women's Health

## 2016-04-15 ENCOUNTER — Other Ambulatory Visit (HOSPITAL_COMMUNITY)
Admission: RE | Admit: 2016-04-15 | Discharge: 2016-04-15 | Disposition: A | Discharge status: Home / Self Care | Payer: Managed Care, Other (non HMO) | Source: Ambulatory Visit | Attending: Obstetrics & Gynecology | Admitting: Obstetrics & Gynecology

## 2016-04-15 VITALS — BP 92/58 | HR 76 | Ht 63.75 in | Wt 110.0 lb

## 2016-04-15 DIAGNOSIS — Z113 Encounter for screening for infections with a predominantly sexual mode of transmission: Secondary | ICD-10-CM | POA: Insufficient documentation

## 2016-04-15 DIAGNOSIS — Z01419 Encounter for gynecological examination (general) (routine) without abnormal findings: Secondary | ICD-10-CM | POA: Diagnosis present

## 2016-04-15 DIAGNOSIS — N898 Other specified noninflammatory disorders of vagina: Secondary | ICD-10-CM | POA: Diagnosis not present

## 2016-04-15 LAB — POCT WET PREP (WET MOUNT)
CLUE CELLS WET PREP WHIFF POC: POSITIVE
Trichomonas Wet Prep HPF POC: ABSENT

## 2016-04-15 MED ORDER — METRONIDAZOLE 0.75 % VA GEL: 1.0000 | Freq: Every day | VAGINAL | 2 refills | Status: DC

## 2016-04-15 MED ORDER — VALACYCLOVIR HCL 1 G PO TABS: 1000.0000 mg | ORAL_TABLET | Freq: Two times a day (BID) | ORAL | 6 refills | Status: DC

## 2016-04-15 NOTE — Progress Notes (Signed)
Subjective:   Sonia Gibson is a 25 y.o. 22P1011 African American female here for a routine well-woman exam.  Patient's last menstrual period was 04/03/2016.    Current complaints: discharge w/ odor, keeps getting BV, not sexually active x 25month, uses ortho evra patch occasionally when she is sexually active. Occ HSV outbreaks, wants meds to take when she gets PCP: none       Does desire labs including STI screening  Social History: Sexual: heterosexual Marital Status: dating Living situation: w/ grandma Occupation: [employer] , billing specialist Tobacco/alcohol: no tobacco or etoh Illicit drugs: no history of illicit drug use  The following portions of the patient's history were reviewed and updated as appropriate: allergies, current medications, past family history, past medical history, past social history, past surgical history and problem list.  Past Medical History Past Medical History:  Diagnosis Date  . BV (bacterial vaginosis) 05/03/2014  . Medical history non-contributory   . Vaginal discharge 05/03/2014    Past Surgical History Past Surgical History:  Procedure Laterality Date  . NO PAST SURGERIES      Gynecologic History G2P1011  Patient's last menstrual period was 04/03/2016. Contraception: ortho evra occasionally Last Pap: >1025yrs ago. Results were: normal Last mammogram: never. Results were: n/a Last TCS: never  Obstetric History OB History  Gravida Para Term Preterm AB Living  2 1 1   1 1   SAB TAB Ectopic Multiple Live Births    1     1    # Outcome Date GA Lbr Len/2nd Weight Sex Delivery Anes PTL Lv  2 Term 03/10/14 [id number] 07:05 / 00:04 6 lb 14.9 oz (3.145 kg) M Vag-Spont None  LIV     Birth Comments: small birth mark noted on l-elbos and right lower abd  1 TAB               Current Medications Current Outpatient Prescriptions on File Prior to Visit  Medication Sig Dispense Refill  . betamethasone valerate (VALISONE) 0.1 % cream APPLY  VERY SMALL AMOUNT OF CREAM DAILY AS NEEDED (Patient not taking: Reported on 04/15/2016) 45 g 0  . cyclobenzaprine (FLEXERIL) 5 MG tablet Take 1 tablet (5 mg total) by mouth 3 (three) times daily as needed for muscle spasms. (Patient not taking: Reported on 04/15/2016) 21 tablet 0  . metroNIDAZOLE (FLAGYL) 500 MG tablet Take 1 tablet (500 mg total) by mouth 2 (two) times daily. (Patient not taking: Reported on 04/15/2016) 14 tablet 0  . naproxen (NAPROSYN) 500 MG tablet Take 1 tablet (500 mg total) by mouth 2 (two) times daily with a meal. (Patient not taking: Reported on 04/15/2016) 20 tablet 0  . norelgestromin-ethinyl estradiol (ORTHO EVRA) 150-35 MCG/24HR transdermal patch Place one patch onto the skin once a week, then after the 3rd patch leave off for a week to have a period (Patient not taking: Reported on 04/15/2016) 3 patch 11  . prenatal vitamin w/FE, FA (PRENATAL 1 + 1) 27-1 MG TABS tablet Take 1 tablet by mouth daily at 12 noon. (Patient not taking: Reported on 04/15/2016) 30 each 12   No current facility-administered medications on file prior to visit.     Review of Systems Patient denies any headaches, blurred vision, shortness of breath, chest pain, abdominal pain, problems with bowel movements, urination, or intercourse.  Objective:  BP (!) 92/58 (BP Location: Right Arm, Patient Position: Sitting, Cuff Size: Normal)   Pulse 76   Ht 5' 3.75" (1.619 m)   Wt 110 lb (  49.9 kg)   LMP 04/03/2016   BMI 19.03 kg/m  Physical Exam  General:  Well developed, well nourished, no acute distress. She is alert and oriented x3. Skin:  Warm and dry Neck:  Midline trachea, no thyromegaly or nodules Cardiovascular: Regular rate and rhythm, no murmur heard Lungs:  Effort normal, all lung fields clear to auscultation bilaterally Breasts:  No dominant palpable mass, retraction, or nipple discharge Abdomen:  Soft, non tender, no hepatosplenomegaly or masses Pelvic:  External genitalia is normal in  appearance.  The vagina is normal in appearance. Strong malodorous thin white d/c. The cervix is bulbous, no CMT.  Thin prep pap is done w/ reflex HR HPV cotesting. Uterus is felt to be normal size, shape, and contour.  No adnexal masses or tenderness noted. Extremities:  No swelling or varicosities noted Psych:  She has a normal mood and affect  Assessment:   Healthy well-woman exam Recurrent BV HSV2  Plan:  GC/CT from pap, HIV, RPR, HepB, CBC, CMP, TSH today Rx metrogel q hs x 5nights (per pt preference), RF x 2, use after sex and during menses Rx valacyclovir 1gm BID x 10d w/ 6RF to use during HSV2 outbreaks Either use ortho evra patches consistently, or use condoms w/ sex F/U 155yr for physical, or sooner if needed Mammogram @26yo  or sooner if problems Colonoscopy @25yo  or sooner if problems  Sonia Gibson, Sonia Gibson CNM, [facility]WHNP-BC 04/15/2016 1:46 PM

## 2016-04-16 LAB — TSH: TSH: 1.84 u[IU]/mL (ref 0.450–4.500)

## 2016-04-16 LAB — COMPREHENSIVE METABOLIC PANEL
ALBUMIN: 4.1 g/dL (ref 3.5–5.5)
ALK PHOS: 65 IU/L (ref 39–117)
ALT: 7 IU/L (ref 0–32)
AST: 14 IU/L (ref 0–40)
Albumin/Globulin Ratio: 1.6 (ref 1.2–2.2)
BUN / CREAT RATIO: 7 — AB (ref 9–23)
BUN: 5 mg/dL — ABNORMAL LOW (ref 6–20)
Bilirubin Total: 0.4 mg/dL (ref 0.0–1.2)
CO2: 27 mmol/L (ref 18–29)
CREATININE: 0.67 mg/dL (ref 0.57–1.00)
Calcium: 9.3 mg/dL (ref 8.7–10.2)
Chloride: 101 mmol/L (ref 96–106)
GFR calc non Af Amer: 122 mL/min/{1.73_m2} (ref >59)
GFR, EST AFRICAN AMERICAN: 140 mL/min/{1.73_m2} (ref >59)
GLOBULIN, TOTAL: 2.5 g/dL (ref 1.5–4.5)
Glucose: 64 mg/dL — ABNORMAL LOW (ref 65–99)
Potassium: 4 mmol/L (ref 3.5–5.2)
SODIUM: 142 mmol/L (ref 134–144)
Total Protein: 6.6 g/dL (ref 6.0–8.5)

## 2016-04-16 LAB — RPR: RPR Ser Ql: NONREACTIVE

## 2016-04-16 LAB — CBC
HEMATOCRIT: 39.8 % (ref 34.0–46.6)
HEMOGLOBIN: 13.3 g/dL (ref 11.1–15.9)
MCH: 30.1 pg (ref 26.6–33.0)
MCHC: 33.4 g/dL (ref 31.5–35.7)
MCV: 90 fL (ref 79–97)
Platelets: 241 10*3/uL (ref 150–379)
RBC: 4.42 x10E6/uL (ref 3.77–5.28)
RDW: 13.4 % (ref 12.3–15.4)
WBC: 6.5 10*3/uL (ref 3.4–10.8)

## 2016-04-16 LAB — HEPATITIS B SURFACE ANTIGEN: Hepatitis B Surface Ag: NEGATIVE

## 2016-04-16 LAB — HIV ANTIBODY (ROUTINE TESTING W REFLEX): HIV Screen 4th Generation wRfx: NONREACTIVE

## 2016-04-20 LAB — CYTOLOGY - PAP
CHLAMYDIA, DNA PROBE: NEGATIVE
Diagnosis: NEGATIVE
NEISSERIA GONORRHEA: NEGATIVE

## 2016-08-11 ENCOUNTER — Other Ambulatory Visit: Payer: Self-pay | Admitting: Women's Health

## 2016-10-05 ENCOUNTER — Ambulatory Visit: Payer: Managed Care, Other (non HMO) | Admitting: Women's Health

## 2016-12-11 ENCOUNTER — Telehealth: Payer: Self-pay | Admitting: Obstetrics and Gynecology

## 2016-12-11 NOTE — Telephone Encounter (Signed)
Pt called stating that she needed medication for the herpes that she was diagnosed with 2 years ago. I informed pt that when reviewing her chart that [facility] Advised pt to call back with any other issues.

## 2016-12-16 ENCOUNTER — Ambulatory Visit: Payer: Managed Care, Other (non HMO) | Admitting: Obstetrics and Gynecology

## 2017-05-05 ENCOUNTER — Other Ambulatory Visit: Payer: Self-pay | Admitting: Women's Health

## 2017-07-14 ENCOUNTER — Telehealth: Payer: Self-pay

## 2017-07-14 NOTE — Telephone Encounter (Signed)
error 

## 2017-07-27 ENCOUNTER — Other Ambulatory Visit: Payer: Managed Care, Other (non HMO) | Admitting: Adult Health

## 2017-11-01 ENCOUNTER — Other Ambulatory Visit (HOSPITAL_COMMUNITY)
Admission: RE | Admit: 2017-11-01 | Discharge: 2017-11-01 | Disposition: A | Discharge status: Home / Self Care | Payer: Managed Care, Other (non HMO) | Source: Ambulatory Visit | Attending: Adult Health | Admitting: Adult Health

## 2017-11-01 ENCOUNTER — Encounter: Payer: Self-pay | Admitting: Women's Health

## 2017-11-01 ENCOUNTER — Ambulatory Visit (INDEPENDENT_AMBULATORY_CARE_PROVIDER_SITE_OTHER): Payer: 59 | Admitting: Women's Health

## 2017-11-01 VITALS — BP 112/71 | HR 82 | Ht 64.0 in | Wt 111.5 lb

## 2017-11-01 DIAGNOSIS — Z01419 Encounter for gynecological examination (general) (routine) without abnormal findings: Secondary | ICD-10-CM | POA: Insufficient documentation

## 2017-11-01 DIAGNOSIS — Z30015 Encounter for initial prescription of vaginal ring hormonal contraceptive: Secondary | ICD-10-CM | POA: Diagnosis not present

## 2017-11-01 DIAGNOSIS — N907 Vulvar cyst: Secondary | ICD-10-CM

## 2017-11-01 DIAGNOSIS — R350 Frequency of micturition: Secondary | ICD-10-CM | POA: Diagnosis not present

## 2017-11-01 DIAGNOSIS — Z113 Encounter for screening for infections with a predominantly sexual mode of transmission: Secondary | ICD-10-CM | POA: Diagnosis not present

## 2017-11-01 DIAGNOSIS — Z01411 Encounter for gynecological examination (general) (routine) with abnormal findings: Secondary | ICD-10-CM | POA: Diagnosis not present

## 2017-11-01 LAB — POCT URINALYSIS DIPSTICK
Glucose, UA: NEGATIVE
KETONES UA: NEGATIVE
Leukocytes, UA: NEGATIVE
Nitrite, UA: NEGATIVE
Protein, UA: POSITIVE — AB

## 2017-11-01 MED ORDER — ETONOGESTREL-ETHINYL ESTRADIOL 0.12-0.015 MG/24HR VA RING: VAGINAL_RING | VAGINAL | 3 refills | Status: DC

## 2017-11-01 NOTE — Progress Notes (Signed)
   WELL-WOMAN EXAMINATION Patient name: Sonia Gibson MRN [id number]  Date of birth: 12/26/1990 Chief Complaint:   Gynecologic Exam (? cyst at panty line)  History of Present Illness:   Sonia Gibson is a 27 y.o. 102P1011 African American female being seen today for a routine well-woman exam.  Current complaints: cyst Rt panty line x 12 years, gets irritated some times. Wants to get on birth control, discussed options- wants nuvaring. Last sex 3-4wks ago, just coming off of a period. Some urinary frequency.   PCP: none      does desire labs Patient's last menstrual period was 10/27/2017. The current method of family planning is none Last pap 04/15/16. Results were: normal Last mammogram: never. Results were: n/a Last colonoscopy: never. Results were: n/a  Review of Systems:   Pertinent items are noted in HPI Denies any headaches, blurred vision, fatigue, shortness of breath, chest pain, abdominal pain, abnormal vaginal discharge/itching/odor/irritation, problems with periods, bowel movements, urination, or intercourse unless otherwise stated above. Pertinent History Reviewed:  Reviewed past medical,surgical, social and family history.  Reviewed problem list, medications and allergies. Physical Assessment:   Vitals:   11/01/17 1441  BP: 112/71  Pulse: 82  Weight: 111 lb 8 oz (50.6 kg)  Height: 5\' 4"  (1.626 m)  Body mass index is 19.14 kg/m.        Physical Examination:   General appearance - well appearing, and in no distress  Mental status - alert, oriented to person, place, and time  Psych:  She has a normal mood and affect  Skin - warm and dry, normal color, no suspicious lesions noted  Chest - effort normal, all lung fields clear to auscultation bilaterally  Heart - normal rate and regular rhythm  Neck:  midline trachea, no thyromegaly or nodules  Breasts - breasts appear normal, no suspicious masses, no skin or nipple changes or  axillary nodes  Abdomen - soft,  nontender, nondistended, no masses or organomegaly  Pelvic - VULVA: Rt labia majora small non-visible, non-tender cyst, normal appearing vulva with no masses, tenderness or lesions  VAGINA: normal appearing vagina with normal color and discharge, no lesions  CERVIX: normal appearing cervix without discharge or lesions, no CMT  Thin prep pap is done per request w/ reflex HR HPV cotesting  UTERUS: uterus is felt to be normal size, shape, consistency and nontender   ADNEXA: No adnexal masses or tenderness noted.  Extremities:  No swelling or varicosities noted  No results found for this or any previous visit (from the past 24 hour(s)).  Assessment & Plan:  1) Well-Woman Exam  2) Urinary frequency> will send cx  3) Cyst Rt labia majora> x 7565yrs, no recent changes, if begins to bother her can make appt w/ MD to remova  4) Contraception management> rx nuvaring disp 3 w/ 3RF  Labs/procedures today: cbc, cmp, tsh, hiv, rpr, hep b, gc/ct, pap, urine cx  Mammogram @28yo  or sooner if problems Colonoscopy @45 -50yo or sooner if problems  Orders Placed This Encounter  Procedures  . Urine Culture  . CBC  . Comprehensive metabolic panel  . TSH  . HIV antibody  . RPR  . Hepatitis B surface antigen    Follow-up: Return in about 3 months (around 02/01/2018) for F/U.  Cheral MarkerKimberly R Glorian Mcdonell CNM, [facility]NP[facility] 11/01/2017 3:18 PM

## 2017-11-01 NOTE — Addendum Note (Signed)
Addended by: Colen DarlingYOUNG, JANET S on: 11/01/2017 03:21 PM   Modules accepted: Orders

## 2017-11-01 NOTE — Patient Instructions (Signed)
Ethinyl Estradiol; Etonogestrel vaginal ring What is this medicine? ETHINYL ESTRADIOL; ETONOGESTREL (ETH in il es tra DYE ole; et oh noe JES trel) vaginal ring is a flexible, vaginal ring used as a contraceptive (birth control method). This medicine combines two types of female hormones, an estrogen and a progestin. This ring is used to prevent ovulation and pregnancy. Each ring is effective for one month. This medicine may be used for other purposes; ask your health care provider or pharmacist if you have questions. COMMON BRAND NAME(S): NuvaRing What should I tell my health care provider before I take this medicine? They need to know if you have or ever had any of these conditions: -abnormal vaginal bleeding -blood vessel disease or blood clots -breast, cervical, endometrial, ovarian, liver, or uterine cancer -diabetes -gallbladder disease -heart disease or recent heart attack -high blood pressure -high cholesterol -kidney disease -liver disease -migraine headaches -stroke -systemic lupus erythematosus (SLE) -tobacco smoker -an unusual or allergic reaction to estrogens, progestins, other medicines, foods, dyes, or preservatives -pregnant or trying to get pregnant -breast-feeding How should I use this medicine? Insert the ring into your vagina as directed. Follow the directions on the prescription label. The ring will remain place for 3 weeks and is then removed for a 1-week break. A new ring is inserted 1 week after the last ring was removed, on the same day of the week. Check often to make sure the ring is still in place, especially before and after sexual intercourse. If the ring was out of the vagina for an unknown amount of time, you may not be protected from pregnancy. Perform a pregnancy test and call your doctor. Do not use more often than directed. A patient package insert for the product will be given with each prescription and refill. Read this sheet carefully each time. The  sheet may change frequently. Contact your pediatrician regarding the use of this medicine in children. Special care may be needed. This medicine has been used in female children who have started having menstrual periods. Overdosage: If you think you have taken too much of this medicine contact a poison control center or emergency room at once. NOTE: This medicine is only for you. Do not share this medicine with others. What if I miss a dose? You will need to replace your vaginal ring once a month as directed. If the ring should slip out, or if you leave it in longer or shorter than you should, contact your health care professional for advice. What may interact with this medicine? Do not take this medicine with the following medication: -dasabuvir; ombitasvir; paritaprevir; ritonavir -ombitasvir; paritaprevir; ritonavir This medicine may also interact with the following medications: -acetaminophen -antibiotics or medicines for infections, especially rifampin, rifabutin, rifapentine, and griseofulvin, and possibly penicillins or tetracyclines -aprepitant -ascorbic acid (vitamin C) -atorvastatin -barbiturate medicines, such as phenobarbital -bosentan -carbamazepine -caffeine -clofibrate -cyclosporine -dantrolene -doxercalciferol -felbamate -grapefruit juice -hydrocortisone -medicines for anxiety or sleeping problems, such as diazepam or temazepam -medicines for diabetes, including pioglitazone -modafinil -mycophenolate -nefazodone -oxcarbazepine -phenytoin -prednisolone -ritonavir or other medicines for HIV infection or AIDS -rosuvastatin -selegiline -soy isoflavones supplements -St. John's wort -tamoxifen or raloxifene -theophylline -thyroid hormones -topiramate -warfarin This list may not describe all possible interactions. Give your health care provider a list of all the medicines, herbs, non-prescription drugs, or dietary supplements you use. Also tell them if you smoke,  drink alcohol, or use illegal drugs. Some items may interact with your medicine. What should I watch for while using   this medicine? Visit your doctor or health care professional for regular checks on your progress. You will need a regular breast and pelvic exam and Pap smear while on this medicine. Use an additional method of contraception during the first cycle that you use this ring. Do not use a diaphragm or female condom, as the ring can interfere with these birth control methods and their proper placement. If you have any reason to think you are pregnant, stop using this medicine right away and contact your doctor or health care professional. If you are using this medicine for hormone related problems, it may take several cycles of use to see improvement in your condition. Smoking increases the risk of getting a blood clot or having a stroke while you are using hormonal birth control, especially if you are more than 28 years old. You are strongly advised not to smoke. This medicine can make your body retain fluid, making your fingers, hands, or ankles swell. Your blood pressure can go up. Contact your doctor or health care professional if you feel you are retaining fluid. This medicine can make you more sensitive to the sun. Keep out of the sun. If you cannot avoid being in the sun, wear protective clothing and use sunscreen. Do not use sun lamps or tanning beds/booths. If you wear contact lenses and notice visual changes, or if the lenses begin to feel uncomfortable, consult your eye care specialist. In some women, tenderness, swelling, or minor bleeding of the gums may occur. Notify your dentist if this happens. Brushing and flossing your teeth regularly may help limit this. See your dentist regularly and inform your dentist of the medicines you are taking. If you are going to have elective surgery, you may need to stop using this medicine before the surgery. Consult your health care professional  for advice. This medicine does not protect you against HIV infection (AIDS) or any other sexually transmitted diseases. What side effects may I notice from receiving this medicine? Side effects that you should report to your doctor or health care professional as soon as possible: -breast tissue changes or discharge -changes in vaginal bleeding during your period or between your periods -chest pain -coughing up blood -dizziness or fainting spells -headaches or migraines -leg, arm or groin pain -severe or sudden headaches -stomach pain (severe) -sudden shortness of breath -sudden loss of coordination, especially on one side of the body -speech problems -symptoms of vaginal infection like itching, irritation or unusual discharge -tenderness in the upper abdomen -vomiting -weakness or numbness in the arms or legs, especially on one side of the body -yellowing of the eyes or skin Side effects that usually do not require medical attention (report to your doctor or health care professional if they continue or are bothersome): -breakthrough bleeding and spotting that continues beyond the 3 initial cycles of pills -breast tenderness -mood changes, anxiety, depression, frustration, anger, or emotional outbursts -increased sensitivity to sun or ultraviolet light -nausea -skin rash, acne, or brown spots on the skin -weight gain (slight) This list may not describe all possible side effects. Call your doctor for medical advice about side effects. You may report side effects to FDA at 1-800-FDA-1088. Where should I keep my medicine? Keep out of the reach of children. Store at room temperature between 15 and 30 degrees C (59 and 86 degrees F) for up to 4 months. The product will expire after 4 months. Protect from light. Throw away any unused medicine after the expiration date. NOTE: This   sheet is a summary. It may not cover all possible information. If you have questions about this medicine, talk  to your doctor, pharmacist, or health care provider.  2018 Elsevier/Gold Standard (2016-01-10 17:00:31)  

## 2017-11-03 LAB — URINE CULTURE

## 2017-11-04 LAB — CYTOLOGY - PAP
Chlamydia: NEGATIVE
DIAGNOSIS: NEGATIVE
Neisseria Gonorrhea: NEGATIVE

## 2017-11-06 ENCOUNTER — Other Ambulatory Visit: Payer: Self-pay | Admitting: Women's Health

## 2017-12-30 ENCOUNTER — Encounter: Payer: Self-pay | Admitting: Women's Health

## 2018-02-01 ENCOUNTER — Ambulatory Visit: Payer: 59 | Admitting: Women's Health

## 2018-02-10 ENCOUNTER — Ambulatory Visit (INDEPENDENT_AMBULATORY_CARE_PROVIDER_SITE_OTHER): Payer: 59 | Admitting: Women's Health

## 2018-02-10 ENCOUNTER — Encounter: Payer: Self-pay | Admitting: Women's Health

## 2018-02-10 ENCOUNTER — Other Ambulatory Visit: Payer: Self-pay

## 2018-02-10 VITALS — BP 98/64 | HR 83 | Ht 64.0 in | Wt 110.2 lb

## 2018-02-10 DIAGNOSIS — Z3202 Encounter for pregnancy test, result negative: Secondary | ICD-10-CM

## 2018-02-10 DIAGNOSIS — Z113 Encounter for screening for infections with a predominantly sexual mode of transmission: Secondary | ICD-10-CM

## 2018-02-10 DIAGNOSIS — R6882 Decreased libido: Secondary | ICD-10-CM | POA: Diagnosis not present

## 2018-02-10 DIAGNOSIS — Z30013 Encounter for initial prescription of injectable contraceptive: Secondary | ICD-10-CM

## 2018-02-10 LAB — POCT URINE PREGNANCY: Preg Test, Ur: NEGATIVE

## 2018-02-10 MED ORDER — MEDROXYPROGESTERONE ACETATE 150 MG/ML IM SUSP: 150.0000 mg | INTRAMUSCULAR | 3 refills | Status: DC

## 2018-02-10 NOTE — Progress Notes (Signed)
   GYN VISIT Patient name: Sonia Gibson MRN [id number]  Date of birth: May 19, 1990 Chief Complaint:   Gynecologic Exam (wants std screening)  History of Present Illness:   Sonia Gibson is a 27 y.o. G61P1011 African American female being seen today for STD screen and to get back on birth control. No current sx STD, Denies abnormal discharge, itching/odor/irritation.  Stopped NuvaRing d/t started having bad headaches. Discussed all options- wants depo. Last sex 2wks ago. Reports decreased libido, has sex 2x/mth, thinks she should want to have sex at least once/wk, is affecting her relationship. Has tried to have sex when not in mood, and just doesn't enjoy it.      Patient's last menstrual period was 02/01/2018 (exact date). The current method of family planning is none and wants depo. Last pap 11/01/17. Results were:  normal Review of Systems:   Pertinent items are noted in HPI Denies fever/chills, dizziness, headaches, visual disturbances, fatigue, shortness of breath, chest pain, abdominal pain, vomiting, abnormal vaginal discharge/itching/odor/irritation, problems with periods, bowel movements, urination, or intercourse unless otherwise stated above.  Pertinent History Reviewed:  Reviewed past medical,surgical, social, obstetrical and family history.  Reviewed problem list, medications and allergies. Physical Assessment:   Vitals:   02/10/18 0902  BP: 98/64  Pulse: 83  Weight: 110 lb 3.2 oz (50 kg)  Height: 5\' 4"  (1.626 m)  Body mass index is 18.92 kg/m.       Physical Examination:   General appearance: alert, well appearing, and in no distress  Mental status: alert, oriented to person, place, and time  Skin: warm & dry   Cardiovascular: normal heart rate noted  Respiratory: normal respiratory effort, no distress  Abdomen: soft, non-tender   Pelvic: examination not indicated  Extremities: no edema   Results for orders placed or performed in visit on 02/10/18 (from the past  24 hour(s))  POCT urine pregnancy   Collection Time: 02/10/18  9:09 AM  Result Value Ref Range   Preg Test, Ur Negative Negative    Assessment & Plan:  1) STD screen> gc/ct from urine, hiv, rpr, hep b  2) Contraception management> rx depo, condoms x 2wks  3) Decreased libido> will check into [facility], will call her if appropriate for her, otherwise, use good lubricant, foreplay, try to increase frequency  Meds:  Meds ordered this encounter  Medications  . medroxyPROGESTERone (DEPO-PROVERA) 150 MG/ML injection    Sig: Inject 1 mL (150 mg total) into the muscle every 3 (three) months.    Dispense:  1 mL    Refill:  3    Order Specific Question:   Supervising Provider    Answer:   Duane Lope H [[id number]]    Orders Placed This Encounter  Procedures  . GC/Chlamydia Probe Amp  . HIV Antibody (routine testing w rflx)  . Hepatitis B surface antigen  . RPR  . POCT urine pregnancy    Return for tomorrow for depo, then after 6/17 for physical.  Cheral Marker CNM, [facility] 02/10/2018 9:54 AM

## 2018-02-10 NOTE — Patient Instructions (Signed)

## 2018-02-11 ENCOUNTER — Other Ambulatory Visit: Payer: Self-pay | Admitting: Women's Health

## 2018-02-11 ENCOUNTER — Ambulatory Visit (INDEPENDENT_AMBULATORY_CARE_PROVIDER_SITE_OTHER): Payer: 59 | Admitting: *Deleted

## 2018-02-11 DIAGNOSIS — Z3042 Encounter for surveillance of injectable contraceptive: Secondary | ICD-10-CM

## 2018-02-11 DIAGNOSIS — R768 Other specified abnormal immunological findings in serum: Secondary | ICD-10-CM

## 2018-02-11 LAB — HIV ANTIBODY (ROUTINE TESTING W REFLEX): HIV Screen 4th Generation wRfx: NONREACTIVE

## 2018-02-11 LAB — HEPATITIS B SURFACE ANTIGEN: Hepatitis B Surface Ag: NEGATIVE

## 2018-02-11 LAB — RPR: RPR: NONREACTIVE

## 2018-02-11 MED ORDER — VALACYCLOVIR HCL 1 G PO TABS: 1000.0000 mg | ORAL_TABLET | Freq: Every day | ORAL | 3 refills | Status: DC

## 2018-02-11 MED ORDER — MEDROXYPROGESTERONE ACETATE 150 MG/ML IM SUSP
150.0000 mg | Freq: Once | INTRAMUSCULAR | Status: AC
  Administered 2018-02-11: 150 mg via INTRAMUSCULAR

## 2018-02-11 NOTE — Progress Notes (Signed)
Depo Provera 150mg IM given in right deltoid with no complications. Pt to return in 12 weeks for next injection.  

## 2018-02-12 LAB — GC/CHLAMYDIA PROBE AMP
CHLAMYDIA, DNA PROBE: NEGATIVE
Neisseria gonorrhoeae by PCR: NEGATIVE

## 2018-04-08 ENCOUNTER — Encounter: Payer: Self-pay | Admitting: Obstetrics & Gynecology

## 2018-04-08 ENCOUNTER — Ambulatory Visit (INDEPENDENT_AMBULATORY_CARE_PROVIDER_SITE_OTHER): Payer: 59 | Admitting: Obstetrics & Gynecology

## 2018-04-08 VITALS — BP 123/88 | HR 86 | Ht 64.0 in | Wt 109.0 lb

## 2018-04-08 DIAGNOSIS — Z202 Contact with and (suspected) exposure to infections with a predominantly sexual mode of transmission: Secondary | ICD-10-CM

## 2018-04-08 NOTE — Addendum Note (Signed)
Addended by: Malachy MoodYOUNG, Janiyla Long S on: 04/08/2018 01:00 PM   Modules accepted: Orders

## 2018-04-08 NOTE — Progress Notes (Signed)
Chief Complaint  Patient presents with  . STD screening      27 y.o. G2P1011 No LMP recorded. Patient has had an injection. The current method of family planning is Depo-Provera injections.  Outpatient Encounter Medications as of 04/08/2018  Medication Sig  . medroxyPROGESTERone (DEPO-PROVERA) 150 MG/ML injection Inject 1 mL (150 mg total) into the muscle every 3 (three) months.  . valACYclovir (VALTREX) 1000 MG tablet Take 1 tablet (1,000 mg total) by mouth daily.   No facility-administered encounter medications on file as of 04/08/2018.     Subjective Pt has had exposure 3-4 months ago to chlamydia from previous partner Recent test negative Past Medical History:  Diagnosis Date  . BV (bacterial vaginosis) 05/03/2014  . Medical history non-contributory   . Vaginal discharge 05/03/2014    Past Surgical History:  Procedure Laterality Date  . NO PAST SURGERIES      OB History    Gravida  2   Para  1   Term  1   Preterm      AB  1   Living  1     SAB      TAB  1   Ectopic      Multiple      Live Births  1           No Known Allergies  Social History   Socioeconomic History  . Marital status: Single    Spouse name: Not on file  . Number of children: Not on file  . Years of education: Not on file  . Highest education level: Not on file  Occupational History  . Not on file  Social Needs  . Financial resource strain: Not on file  . Food insecurity:    Worry: Not on file    Inability: Not on file  . Transportation needs:    Medical: Not on file    Non-medical: Not on file  Tobacco Use  . Smoking status: Never Smoker  . Smokeless tobacco: Never Used  Substance and Sexual Activity  . Alcohol use: No  . Drug use: No  . Sexual activity: Yes    Birth control/protection: Condom, Injection  Lifestyle  . Physical activity:    Days per week: Not on file    Minutes per session: Not on file  . Stress: Not on file  Relationships  .  Social connections:    Talks on phone: Not on file    Gets together: Not on file    Attends religious service: Not on file    Active member of club or organization: Not on file    Attends meetings of clubs or organizations: Not on file    Relationship status: Not on file  Other Topics Concern  . Not on file  Social History Narrative  . Not on file    History reviewed. No pertinent family history.  Medications:       Current Outpatient Medications:  .  medroxyPROGESTERone (DEPO-PROVERA) 150 MG/ML injection, Inject 1 mL (150 mg total) into the muscle every 3 (three) months., Disp: 1 mL, Rfl: 3 .  valACYclovir (VALTREX) 1000 MG tablet, Take 1 tablet (1,000 mg total) by mouth daily., Disp: 90 tablet, Rfl: 3  Objective Blood pressure 123/88, pulse 86, height 5\' 4"  (1.626 m), weight 109 lb (49.4 kg).  Cultures perfomred Blood in vault  Pertinent ROS   Labs or studies     Impression Diagnoses this Encounter::   ICD-10-CM  1. Exposure to chlamydia Z20.2     Established relevant diagnosis(es):   Plan/Recommendations: No orders of the defined types were placed in this encounter.   Labs or Scans Ordered: No orders of the defined types were placed in this encounter.   Management:: >per pt request oral and genital STI screen performed  Follow up Return if symptoms worsen or fail to improve.        All questions were answered.

## 2018-04-10 LAB — GC/CHLAMYDIA PROBE AMP
CHLAMYDIA, DNA PROBE: NEGATIVE
Chlamydia trachomatis, NAA: NEGATIVE
Neisseria gonorrhoeae by PCR: NEGATIVE
Neisseria gonorrhoeae by PCR: NEGATIVE

## 2018-05-06 ENCOUNTER — Ambulatory Visit: Payer: 59

## 2018-06-07 ENCOUNTER — Ambulatory Visit: Payer: 59 | Admitting: Adult Health

## 2018-06-10 ENCOUNTER — Ambulatory Visit (INDEPENDENT_AMBULATORY_CARE_PROVIDER_SITE_OTHER): Payer: No Typology Code available for payment source | Admitting: Family Medicine

## 2018-06-10 ENCOUNTER — Encounter: Payer: Self-pay | Admitting: Family Medicine

## 2018-06-10 VITALS — BP 118/82 | HR 82 | Temp 98.1°F | Ht 64.0 in | Wt 114.0 lb

## 2018-06-10 DIAGNOSIS — Z7689 Persons encountering health services in other specified circumstances: Secondary | ICD-10-CM

## 2018-06-10 DIAGNOSIS — H539 Unspecified visual disturbance: Secondary | ICD-10-CM

## 2018-06-10 DIAGNOSIS — R768 Other specified abnormal immunological findings in serum: Secondary | ICD-10-CM | POA: Diagnosis not present

## 2018-06-10 DIAGNOSIS — L309 Dermatitis, unspecified: Secondary | ICD-10-CM | POA: Diagnosis not present

## 2018-06-10 NOTE — Progress Notes (Signed)
Patient presents to clinic today to establish care.  SUBJECTIVE: PMH:  Pt is a 28 yo female with pmh sig for eczema, HSV.  Pt followed by her OB/Gyn Dr. Despina Hidden at [facility].  Eczema: -using her son's hydrocortisone 2.5% cream on her face. -pt cleaning her face with Dove soap, rubbing EtOH, and using aveeno lotion or vaseline -endorses h/o eczema since a kid. -typically with rash on flexural areas of arms.  Genital herpes: -Diagnosed after lab work during pregnancy -denies any outbreaks -Given Rx for Valtrex 1000 mg daily -states she takes medication mostly around menses and she notices vaginal discomfort.  Vision concerns: -Pt states she has an appointment coming up with [place] [facility] -Endorses seeing a "flash in the side of vision" of right eye -States "cannot describe" the issue with the vision in her left eye at times. -Pt wears contacts  Allergies: NKDA  Past surgical history: None  Social history: Patient is single.  Patient has a young son.  Patient works for [employer] in Clinical biochemist.  Patient endorses social alcohol use.  Patient denies tobacco and drug use.  Health Maintenance: Vision --last eye exam 2019 Immunizations --influenza vaccine 2019, TB test 2019 PAP --2019 LMP--05/29/2018  Past Medical History:  Diagnosis Date  . BV (bacterial vaginosis) 05/03/2014  . Medical history non-contributory   . Vaginal discharge 05/03/2014    Past Surgical History:  Procedure Laterality Date  . NO PAST SURGERIES      Current Outpatient Medications on File Prior to Visit  Medication Sig Dispense Refill  . valACYclovir (VALTREX) 1000 MG tablet Take 1 tablet (1,000 mg total) by mouth daily. 90 tablet 3   No current facility-administered medications on file prior to visit.     No Known Allergies  History reviewed. No pertinent family history.  Social History   Socioeconomic History  . Marital status: Single    Spouse name: Not on file  .  Number of children: Not on file  . Years of education: Not on file  . Highest education level: Not on file  Occupational History  . Not on file  Social Needs  . Financial resource strain: Not on file  . Food insecurity:    Worry: Not on file    Inability: Not on file  . Transportation needs:    Medical: Not on file    Non-medical: Not on file  Tobacco Use  . Smoking status: Never Smoker  . Smokeless tobacco: Never Used  Substance and Sexual Activity  . Alcohol use: No  . Drug use: No  . Sexual activity: Yes    Birth control/protection: Condom, Injection  Lifestyle  . Physical activity:    Days per week: Not on file    Minutes per session: Not on file  . Stress: Not on file  Relationships  . Social connections:    Talks on phone: Not on file    Gets together: Not on file    Attends religious service: Not on file    Active member of club or organization: Not on file    Attends meetings of clubs or organizations: Not on file    Relationship status: Not on file  . Intimate partner violence:    Fear of current or ex partner: Not on file    Emotionally abused: Not on file    Physically abused: Not on file    Forced sexual activity: Not on file  Other Topics Concern  . Not on  file  Social History Narrative  . Not on file    ROS General: Denies fever, chills, night sweats, changes in weight, changes in appetite HEENT: Denies headaches, ear pain, rhinorrhea, sore throat  +changes in vision CV: Denies CP, palpitations, SOB, orthopnea Pulm: Denies SOB, cough, wheezing GI: Denies abdominal pain, nausea, vomiting, diarrhea, constipation GU: Denies dysuria, hematuria, frequency, vaginal discharge Msk: Denies muscle cramps, joint pains Neuro: Denies weakness, numbness, tingling Skin: Denies rashes, bruising  +eczema Psych: Denies depression, anxiety, hallucinations  BP 118/82 (BP Location: Left Arm, Patient Position: Sitting, Cuff Size: Normal)   Pulse 82   Temp 98.1 F  (36.7 C) (Oral)   Ht 5\' 4"  (1.626 m)   Wt 114 lb (51.7 kg)   BMI 19.57 kg/m   Physical Exam Gen. Pleasant, well developed, well-nourished, in NAD HEENT - [place]/AT, PERRL, no scleral icterus, no nasal drainage, pharynx without erythema or exudate.  TMs normal bilaterally. Lungs: no use of accessory muscles, CTAB, no wheezes, rales or rhonchi Cardiovascular: RRR, No r/g/m, no peripheral edema Abdomen: BS present, soft, nontender,nondistended Neuro:  A&Ox3, CN II-XII intact, normal gait Skin:  Warm, dry, intact.  Fine papules on face and arms b/l.,  Hyperpigmentation  Recent Results (from the past 2160 hour(s))  GC/Chlamydia Probe Amp     Status: None   Collection Time: 04/08/18  2:47 PM  Result Value Ref Range   Chlamydia trachomatis, NAA Negative Negative   Neisseria gonorrhoeae by PCR Negative Negative  GC/Chlamydia Probe Amp     Status: None   Collection Time: 04/08/18  2:48 PM  Result Value Ref Range   Chlamydia trachomatis, NAA Negative Negative   Neisseria gonorrhoeae by PCR Negative Negative    Assessment/Plan: Eczema, unspecified type -Discussed using a gentle soap and moisturizer to cleanse face with -Patient encouraged not to use alcohol on her face. -Given handout -Discussed staying hydrated -Discussed limiting use of steroid creams on the face.  Vision changes -Encouraged to keep appointment with [place] [facility]  HSV-2 seropositive -Continue Valtrex 1000 mg daily as needed -Discussed safe sex practices  Encounter to establish care -We reviewed the PMH, PSH, FH, SH, Meds and Allergies. -We provided refills for any medications we will prescribe as needed. -We addressed current concerns per orders and patient instructions. -We have asked for records for pertinent exams, studies, vaccines and notes from previous providers. -We have advised patient to follow up per instructions below.   F/u prn  Abbe Amsterdam, MD

## 2018-06-10 NOTE — Patient Instructions (Signed)

## 2018-08-05 ENCOUNTER — Ambulatory Visit (INDEPENDENT_AMBULATORY_CARE_PROVIDER_SITE_OTHER): Payer: No Typology Code available for payment source | Admitting: Family Medicine

## 2018-08-05 ENCOUNTER — Encounter: Payer: Self-pay | Admitting: Family Medicine

## 2018-08-05 ENCOUNTER — Other Ambulatory Visit: Payer: Self-pay

## 2018-08-05 VITALS — BP 112/80 | HR 82 | Temp 98.4°F | Wt 116.0 lb

## 2018-08-05 DIAGNOSIS — Z13220 Encounter for screening for lipoid disorders: Secondary | ICD-10-CM

## 2018-08-05 DIAGNOSIS — Z0000 Encounter for general adult medical examination without abnormal findings: Secondary | ICD-10-CM

## 2018-08-05 DIAGNOSIS — Z131 Encounter for screening for diabetes mellitus: Secondary | ICD-10-CM | POA: Diagnosis not present

## 2018-08-05 LAB — CBC WITH DIFFERENTIAL/PLATELET
BASOS ABS: 0 10*3/uL (ref 0.0–0.1)
Basophils Relative: 0.5 % (ref 0.0–3.0)
Eosinophils Absolute: 0 10*3/uL (ref 0.0–0.7)
Eosinophils Relative: 0.8 % (ref 0.0–5.0)
HEMATOCRIT: 40.4 % (ref 36.0–46.0)
HEMOGLOBIN: 13.7 g/dL (ref 12.0–15.0)
Lymphocytes Relative: 39.1 % (ref 12.0–46.0)
Lymphs Abs: 2 10*3/uL (ref 0.7–4.0)
MCHC: 34 g/dL (ref 30.0–36.0)
MCV: 92 fl (ref 78.0–100.0)
MONO ABS: 0.3 10*3/uL (ref 0.1–1.0)
Monocytes Relative: 6.5 % (ref 3.0–12.0)
Neutro Abs: 2.7 10*3/uL (ref 1.4–7.7)
Neutrophils Relative %: 53.1 % (ref 43.0–77.0)
Platelets: 191 10*3/uL (ref 150.0–400.0)
RBC: 4.39 Mil/uL (ref 3.87–5.11)
RDW: 13.8 % (ref 11.5–15.5)
WBC: 5 10*3/uL (ref 4.0–10.5)

## 2018-08-05 LAB — LIPID PANEL
CHOL/HDL RATIO: 3
Cholesterol: 157 mg/dL (ref 0–200)
HDL: 46.8 mg/dL (ref >39.00)
LDL Cholesterol: 99 mg/dL (ref 0–99)
NONHDL: 110.53
TRIGLYCERIDES: 57 mg/dL (ref 0.0–149.0)
VLDL: 11.4 mg/dL (ref 0.0–40.0)

## 2018-08-05 LAB — BASIC METABOLIC PANEL
BUN: 8 mg/dL (ref 6–23)
CHLORIDE: 106 meq/L (ref 96–112)
CO2: 23 mEq/L (ref 19–32)
Calcium: 8.9 mg/dL (ref 8.4–10.5)
Creatinine, Ser: 0.55 mg/dL (ref 0.40–1.20)
GFR: 158.18 mL/min (ref >60.00)
GLUCOSE: 81 mg/dL (ref 70–99)
Potassium: 3.8 mEq/L (ref 3.5–5.1)
SODIUM: 138 meq/L (ref 135–145)

## 2018-08-05 LAB — HEMOGLOBIN A1C: HEMOGLOBIN A1C: 5.2 % (ref 4.6–6.5)

## 2018-08-05 NOTE — Patient Instructions (Signed)
Preventive Care 18-39 Years, Female Preventive care refers to lifestyle choices and visits with your health care provider that can promote health and wellness. What does preventive care include?   A yearly physical exam. This is also called an annual well check.  Dental exams once or twice a year.  Routine eye exams. Ask your health care provider how often you should have your eyes checked.  Personal lifestyle choices, including: ? Daily care of your teeth and gums. ? Regular physical activity. ? Eating a healthy diet. ? Avoiding tobacco and drug use. ? Limiting alcohol use. ? Practicing safe sex. ? Taking vitamin and mineral supplements as recommended by your health care provider. What happens during an annual well check? The services and screenings done by your health care provider during your annual well check will depend on your age, overall health, lifestyle risk factors, and family history of disease. Counseling Your health care provider may ask you questions about your:  Alcohol use.  Tobacco use.  Drug use.  Emotional well-being.  Home and relationship well-being.  Sexual activity.  Eating habits.  Work and work environment.  Method of birth control.  Menstrual cycle.  Pregnancy history. Screening You may have the following tests or measurements:  Height, weight, and BMI.  Diabetes screening. This is done by checking your blood sugar (glucose) after you have not eaten for a while (fasting).  Blood pressure.  Lipid and cholesterol levels. These may be checked every 5 years starting at age 20.  Skin check.  Hepatitis C blood test.  Hepatitis B blood test.  Sexually transmitted disease (STD) testing.  BRCA-related cancer screening. This may be done if you have a family history of breast, ovarian, tubal, or peritoneal cancers.  Pelvic exam and Pap test. This may be done every 3 years starting at age 21. Starting at age 30, this may be done every 5  years if you have a Pap test in combination with an HPV test. Discuss your test results, treatment options, and if necessary, the need for more tests with your health care provider. Vaccines Your health care provider may recommend certain vaccines, such as:  Influenza vaccine. This is recommended every year.  Tetanus, diphtheria, and acellular pertussis (Tdap, Td) vaccine. You may need a Td booster every 10 years.  Varicella vaccine. You may need this if you have not been vaccinated.  HPV vaccine. If you are 26 or younger, you may need three doses over 6 months.  Measles, mumps, and rubella (MMR) vaccine. You may need at least one dose of MMR. You may also need a second dose.  Pneumococcal 13-valent conjugate (PCV13) vaccine. You may need this if you have certain conditions and were not previously vaccinated.  Pneumococcal polysaccharide (PPSV23) vaccine. You may need one or two doses if you smoke cigarettes or if you have certain conditions.  Meningococcal vaccine. One dose is recommended if you are age 19-21 years and a first-year college student living in a residence hall, or if you have one of several medical conditions. You may also need additional booster doses.  Hepatitis A vaccine. You may need this if you have certain conditions or if you travel or work in places where you may be exposed to hepatitis A.  Hepatitis B vaccine. You may need this if you have certain conditions or if you travel or work in places where you may be exposed to hepatitis B.  Haemophilus influenzae type b (Hib) vaccine. You may need this if you   have certain risk factors. Talk to your health care provider about which screenings and vaccines you need and how often you need them. This information is not intended to replace advice given to you by your health care provider. Make sure you discuss any questions you have with your health care provider. Document Released: 06/30/2001 Document Revised: 12/15/2016  Document Reviewed: 03/05/2015 Elsevier Interactive Patient Education  2019 [employer].

## 2018-08-05 NOTE — Progress Notes (Signed)
Subjective:     Sonia Gibson is a 28 y.o. female and is here for a comprehensive physical exam. The patient reports no problems.  States eczema has improved since taking warm showers.  Social History   Socioeconomic History  . Marital status: Single    Spouse name: Not on file  . Number of children: Not on file  . Years of education: Not on file  . Highest education level: Not on file  Occupational History  . Not on file  Social Needs  . Financial resource strain: Not on file  . Food insecurity:    Worry: Not on file    Inability: Not on file  . Transportation needs:    Medical: Not on file    Non-medical: Not on file  Tobacco Use  . Smoking status: Never Smoker  . Smokeless tobacco: Never Used  Substance and Sexual Activity  . Alcohol use: No  . Drug use: No  . Sexual activity: Yes    Birth control/protection: Condom, Injection  Lifestyle  . Physical activity:    Days per week: Not on file    Minutes per session: Not on file  . Stress: Not on file  Relationships  . Social connections:    Talks on phone: Not on file    Gets together: Not on file    Attends religious service: Not on file    Active member of club or organization: Not on file    Attends meetings of clubs or organizations: Not on file    Relationship status: Not on file  . Intimate partner violence:    Fear of current or ex partner: Not on file    Emotionally abused: Not on file    Physically abused: Not on file    Forced sexual activity: Not on file  Other Topics Concern  . Not on file  Social History Narrative  . Not on file   Health Maintenance  Topic Date Due  . TETANUS/TDAP  08/24/2008  . INFLUENZA VACCINE  12/16/2017  . PAP-Cervical Cytology Screening  11/01/2020  . PAP SMEAR-Modifier  11/01/2020  . HIV Screening  Completed    The following portions of the patient's history were reviewed and updated as appropriate: allergies, current medications, past family history, past medical  history, past social history, past surgical history and problem list.  Review of Systems Pertinent items noted in HPI and remainder of comprehensive ROS otherwise negative.   Objective:    BP 112/80 (BP Location: Left Arm, Patient Position: Sitting, Cuff Size: Normal)   Pulse 82   Temp 98.4 F (36.9 C) (Oral)   Wt 116 lb (52.6 kg)   SpO2 97%   BMI 19.91 kg/m  General appearance: alert, cooperative and no distress Head: Normocephalic, without obvious abnormality, atraumatic Eyes: conjunctivae/corneas clear. PERRL, EOM's intact. Fundi benign. Ears: normal TM's and external ear canals both ears Nose: Nares normal. Septum midline. Mucosa normal. No drainage or sinus tenderness. Throat: lips, mucosa, and tongue normal; teeth and gums normal Neck: no adenopathy, no carotid bruit, no JVD, supple, symmetrical, trachea midline and thyroid not enlarged, symmetric, no tenderness/mass/nodules Lungs: clear to auscultation bilaterally Heart: regular rate and rhythm, S1, S2 normal, no murmur, click, rub or gallop Abdomen: soft, non-tender; bowel sounds normal; no masses,  no organomegaly Extremities: extremities normal, atraumatic, no cyanosis or edema Pulses: 2+ and symmetric Skin: Skin color, texture, turgor normal. No rashes or lesions Lymph nodes: Cervical, supraclavicular, and axillary nodes normal. Neurologic: Alert and oriented  X 3, normal strength and tone. Normal symmetric reflexes. Normal coordination and gait    Assessment:    Healthy female exam.      Plan:     Anticipatory guidance given including wearing seatbelts, smoke detectors in the home, increasing physical activity, increasing p.o. intake of water and vegetables. -will obtain labs -pap up to date, done 11/01/17 -given handout See After Visit Summary for Counseling Recommendations

## 2018-11-24 ENCOUNTER — Telehealth: Payer: Self-pay

## 2018-11-24 ENCOUNTER — Other Ambulatory Visit: Payer: Self-pay

## 2018-11-24 ENCOUNTER — Ambulatory Visit (INDEPENDENT_AMBULATORY_CARE_PROVIDER_SITE_OTHER): Payer: No Typology Code available for payment source | Admitting: Family Medicine

## 2018-11-24 DIAGNOSIS — Z113 Encounter for screening for infections with a predominantly sexual mode of transmission: Secondary | ICD-10-CM

## 2018-11-24 NOTE — Progress Notes (Signed)
Virtual Visit via Video Note  I connected with Sonia Gibson  on 11/24/18 at  1:00 PM EDT by a video enabled telemedicine application and verified that I am speaking with the correct person using two identifiers.  Location patient: car, in [place] Location provider:work or home office Persons participating in the virtual visit: patient, provider  I discussed the limitations of evaluation and management by telemedicine and the availability of in person appointments. The patient expressed understanding and agreed to proceed.   HPI:  Acute visit for Desire for STI screening: -denies any symptoms -does have new sexual partner -hx of genital herpes -does not always use condoms -FDLMP: June 30th -denies vaginal discharge, pelvic pain, pain with intercourse, fevers or any symptoms -she wants HIV check specifically and declines pelvic, GC/CHlam/Trich screening. Agrees to RPR with the HIV check.    ROS: See pertinent positives and negatives per HPI.  Past Medical History:  Diagnosis Date  . BV (bacterial vaginosis) 05/03/2014  . Medical history non-contributory   . Vaginal discharge 05/03/2014    Past Surgical History:  Procedure Laterality Date  . NO PAST SURGERIES      No family history on file.  SOCIAL HX: se ehpi   Current Outpatient Medications:  .  valACYclovir (VALTREX) 1000 MG tablet, Take 1 tablet (1,000 mg total) by mouth daily., Disp: 90 tablet, Rfl: 3  EXAM:  VITALS per patient if applicable:  GENERAL: alert, oriented, appears well and in no acute distress  HEENT: atraumatic, conjunttiva clear, no obvious abnormalities on inspection of external nose and ears  NECK: normal movements of the head and neck  LUNGS: on inspection no signs of respiratory distress, breathing rate appears normal, no obvious gross SOB, gasping or wheezing  CV: no obvious cyanosis  MS: moves all visible extremities without noticeable abnormality  PSYCH/NEURO: pleasant and cooperative, no  obvious depression or anxiety, speech and thought processing grossly intact  ASSESSMENT AND PLAN:  Discussed the following assessment and plan:  Routine screening for STI (sexually transmitted infection) - Plan: HIV Antibody (routine testing w rflx), RPR -discussed safe sexual practices and pregnancy prevention -offered testing for all STIs, she preferred to do lab visit for HIV/RPR only, orders placed -sent message to scheduler to arrange and schedule lab visit  I discussed the assessment and treatment plan with the patient. The patient was provided an opportunity to ask questions and all were answered. The patient agreed with the plan and demonstrated an understanding of the instructions.   The patient was advised to follow up as needed   Patient Instructions  I placed the orders you requested.   Someone from our office should reach out to you to set up a lab visit. If someone has not contacted you in the next 1-2 days, please call our office.   Safe Sex Practicing safe sex means taking steps before and during sex to reduce your risk of:  Getting an STI (sexually transmitted infection).  Giving your partner an STI.  Unwanted or unplanned pregnancy. How can I practice safe sex?     Ways you can practice safe sex  Limit your sexual partners to only one partner who is having sex with only you.  Avoid using alcohol and drugs before having sex. Alcohol and drugs can affect your judgment.  Before having sex with a new partner: ? Talk to your partner about past partners, past STIs, and drug use. ? Get screened for STIs and discuss the results with your partner. Ask your  partner to get screened, too.  Check your body regularly for sores, blisters, rashes, or unusual discharge. If you notice any of these problems, visit your health care provider.  Avoid sexual contact if you have symptoms of an infection or you are being treated for an STI.  While having sex, use a condom.  Make sure to: ? Use a condom every time you have vaginal, oral, or anal sex. Both females and males should wear condoms during oral sex. ? Keep condoms in place from the beginning to the end of sexual activity. ? Use a latex condom, if possible. Latex condoms offer the best protection. ? Use only water-based lubricants with a condom. Using petroleum-based lubricants or oils will weaken the condom and increase the chance that it will break. Ways your health care provider can help you practice safe sex  See your health care provider for regular screenings, exams, and tests for STIs.  Talk with your health care provider about what kind of birth control (contraception) is best for you.  Get vaccinated against hepatitis B and human papillomavirus (HPV).  If you are at risk of being infected with HIV (human immunodeficiency virus), talk with your health care provider about taking a prescription medicine to prevent HIV infection. You are at risk for HIV if you: ? Are a man who has sex with other men. ? Are sexually active with more than one partner. ? Take drugs by injection. ? Have a sex partner who has HIV. ? Have unprotected sex. ? Have sex with someone who has sex with both men and women. ? Have had an STI. Follow these instructions at home:  Take over-the-counter and prescription medicines as told by your health care provider.  Keep all follow-up visits as told by your health care provider. This is important. Where to find more information  Centers for Disease Control and Prevention: PinkCheek.be  Planned Parenthood: https://www.plannedparenthood.org/  Office on Women's Health: BasketballVoice.it Summary  Practicing safe sex means taking steps before and during sex to reduce your risk of STIs, giving your partner STIs, and having an unwanted or unplanned pregnancy.  Before having sex with a new  partner, talk to your partner about past partners, past STIs, and drug use.  Use a condom every time you have vaginal, oral, or anal sex. Both females and males should wear condoms during oral sex.  Check your body regularly for sores, blisters, rashes, or unusual discharge. If you notice any of these problems, visit your health care provider.  See your health care provider for regular screenings, exams, and tests for STIs. This information is not intended to replace advice given to you by your health care provider. Make sure you discuss any questions you have with your health care provider. Document Released: 06/11/2004 Document Revised: 08/26/2018 Document Reviewed: 02/14/2018 Elsevier Patient Education  2020 [place], DO

## 2018-11-24 NOTE — Telephone Encounter (Signed)
Spoke with pt stated that she had a virtual visit with Dr Maudie Mercury, states that she had labs placed for STI screening, no longer needs appointment

## 2018-11-24 NOTE — Patient Instructions (Signed)
I placed the orders you requested.   Someone from our office should reach out to you to set up a lab visit. If someone has not contacted you in the next 1-2 days, please call our office.   Safe Sex Practicing safe sex means taking steps before and during sex to reduce your risk of:  Getting an STI (sexually transmitted infection).  Giving your partner an STI.  Unwanted or unplanned pregnancy. How can I practice safe sex?     Ways you can practice safe sex  Limit your sexual partners to only one partner who is having sex with only you.  Avoid using alcohol and drugs before having sex. Alcohol and drugs can affect your judgment.  Before having sex with a new partner: ? Talk to your partner about past partners, past STIs, and drug use. ? Get screened for STIs and discuss the results with your partner. Ask your partner to get screened, too.  Check your body regularly for sores, blisters, rashes, or unusual discharge. If you notice any of these problems, visit your health care provider.  Avoid sexual contact if you have symptoms of an infection or you are being treated for an STI.  While having sex, use a condom. Make sure to: ? Use a condom every time you have vaginal, oral, or anal sex. Both females and males should wear condoms during oral sex. ? Keep condoms in place from the beginning to the end of sexual activity. ? Use a latex condom, if possible. Latex condoms offer the best protection. ? Use only water-based lubricants with a condom. Using petroleum-based lubricants or oils will weaken the condom and increase the chance that it will break. Ways your health care provider can help you practice safe sex  See your health care provider for regular screenings, exams, and tests for STIs.  Talk with your health care provider about what kind of birth control (contraception) is best for you.  Get vaccinated against hepatitis B and human papillomavirus (HPV).  If you are at risk  of being infected with HIV (human immunodeficiency virus), talk with your health care provider about taking a prescription medicine to prevent HIV infection. You are at risk for HIV if you: ? Are a man who has sex with other men. ? Are sexually active with more than one partner. ? Take drugs by injection. ? Have a sex partner who has HIV. ? Have unprotected sex. ? Have sex with someone who has sex with both men and women. ? Have had an STI. Follow these instructions at home:  Take over-the-counter and prescription medicines as told by your health care provider.  Keep all follow-up visits as told by your health care provider. This is important. Where to find more information  Centers for Disease Control and Prevention: LessFurniture.behttps://www.cdc.gov/std/prevention/default.htm  Planned Parenthood: https://www.plannedparenthood.org/  Office on Women's Health: EmploymentTracking.tnhttps://www.womenshealth.gov/a-z-topics/sexually-transmitted-infections Summary  Practicing safe sex means taking steps before and during sex to reduce your risk of STIs, giving your partner STIs, and having an unwanted or unplanned pregnancy.  Before having sex with a new partner, talk to your partner about past partners, past STIs, and drug use.  Use a condom every time you have vaginal, oral, or anal sex. Both females and males should wear condoms during oral sex.  Check your body regularly for sores, blisters, rashes, or unusual discharge. If you notice any of these problems, visit your health care provider.  See your health care provider for regular screenings, exams, and tests  for STIs. This information is not intended to replace advice given to you by your health care provider. Make sure you discuss any questions you have with your health care provider. Document Released: 06/11/2004 Document Revised: 08/26/2018 Document Reviewed: 02/14/2018 Elsevier Patient Education  2020 [employer].

## 2018-11-25 ENCOUNTER — Other Ambulatory Visit (INDEPENDENT_AMBULATORY_CARE_PROVIDER_SITE_OTHER): Payer: No Typology Code available for payment source

## 2018-11-25 DIAGNOSIS — Z113 Encounter for screening for infections with a predominantly sexual mode of transmission: Secondary | ICD-10-CM

## 2018-11-28 LAB — RPR: RPR Ser Ql: NONREACTIVE

## 2018-11-28 LAB — HIV ANTIBODY (ROUTINE TESTING W REFLEX): HIV 1&2 Ab, 4th Generation: NONREACTIVE

## 2018-12-21 MED FILL — valACYclovir HCL 1 GM TABS: 1 | 90 days supply | Qty: 90 | Fill #0

## 2018-12-23 ENCOUNTER — Telehealth (INDEPENDENT_AMBULATORY_CARE_PROVIDER_SITE_OTHER): Payer: Self-pay

## 2018-12-23 ENCOUNTER — Telehealth: Payer: No Typology Code available for payment source | Admitting: Physician Assistant

## 2018-12-23 DIAGNOSIS — N898 Other specified noninflammatory disorders of vagina: Secondary | ICD-10-CM

## 2018-12-23 NOTE — Progress Notes (Signed)
Based on what you shared with me, I feel your condition warrants further evaluation and I recommend that you be seen for a face to face office visit.   Because you have have had a new sexual contact within the past 2 months and are now having vaginal discharge, you will need to be evaluated in person so the appropriate labs and samples can be collected to determine the cause of your symptoms.   NOTE: If you entered your credit card information for this eVisit, you will not be charged. You may see a "hold" on your card for the $35 but that hold will drop off and you will not have a charge processed.  If you are having a true medical emergency please call 911.     The following sites will take your insurance:  . [facility]    [phone]                  Get Driving Directions  [address], [place] [address] . 10 am to 8 pm Monday-Friday . 12 pm to 8 pm Saturday-Sunday   . [facility] at [place]                  Get Driving Directions  [address], [place], [place] [address] . 8 am to 8 pm Monday-Friday . 9 am to 6 pm Saturday . 11 am to 6 pm Sunday   . [facility] at [place]                  Get Driving Directions   [address] [place], [place] [address] . 8 am to 8 pm Monday-Friday . 8 am to 4 pm Saturday-Sunday    . [facility] at [place]                    Get Driving Directions  [phone]  [address] [place], [place] [address]  . Monday-Friday, 12 PM to 6 PM    Your e-visit answers were reviewed by a board certified advanced clinical practitioner to complete your personal care plan.  Thank you for using e-Visits.  I have spent 5 minutes in review of e-visit questionnaire, review and updating patient chart, medical decision making and response to patient.    Tenna Delaine, PA-C

## 2018-12-27 ENCOUNTER — Encounter: Payer: Self-pay | Admitting: Obstetrics & Gynecology

## 2018-12-27 ENCOUNTER — Ambulatory Visit (INDEPENDENT_AMBULATORY_CARE_PROVIDER_SITE_OTHER): Payer: No Typology Code available for payment source | Admitting: Obstetrics & Gynecology

## 2018-12-27 ENCOUNTER — Other Ambulatory Visit: Payer: Self-pay

## 2018-12-27 VITALS — BP 126/82 | HR 74 | Ht 64.0 in | Wt 119.0 lb

## 2018-12-27 DIAGNOSIS — N898 Other specified noninflammatory disorders of vagina: Secondary | ICD-10-CM | POA: Diagnosis not present

## 2018-12-27 DIAGNOSIS — B9689 Other specified bacterial agents as the cause of diseases classified elsewhere: Secondary | ICD-10-CM | POA: Diagnosis not present

## 2018-12-27 DIAGNOSIS — N760 Acute vaginitis: Secondary | ICD-10-CM | POA: Diagnosis not present

## 2018-12-27 MED ORDER — METRONIDAZOLE 0.75 % VA GEL: VAGINAL | 0 refills | Status: DC

## 2018-12-27 MED FILL — metroNIDAZOLE 0.75 % GEL: 0.75 | 5 days supply | Qty: 70 | Fill #0

## 2018-12-27 NOTE — Progress Notes (Signed)
       Chief Complaint  Patient presents with  . Vaginal Discharge    Blood pressure 126/82, pulse 74, height 5\' 4"  (1.626 m), weight 119 lb (54 kg).  28 y.o. [id number] No LMP recorded. Patient has had an injection. The current method of family planning is none.  Subjective Vaginal discharge for 2weeks Itching no Irritation yes Odor yes Similar to previous yes  Previous treatment metronidazole  Objective Vulva:  normal appearing vulva with no masses, tenderness or lesions Vagina:  normal mucosa, thin grey discharge Cervix:  no cervical motion tenderness and no lesions Uterus:  normal size, contour, position, consistency, mobility, non-tender Adnexa: ovaries:present,       Pertinent ROS No burning with urination, frequency or urgency No nausea, vomiting or diarrhea Nor fever chills or other constitutional symptoms   Labs or studies Wet Prep:   A sample of vaginal discharge was obtained from the posterior fornix using a cotton swab. 2 drops of saline were placed on a slide and the cotton swab was immersed in the saline. Microscopic evaluation was performed and results were as follows:  Negative  for yeast  Positive for clue cells , consistent with Bacterial vaginosis Negative for trichomonas  Normal WBC population   Whiff test: Positive     Impression Diagnoses this Encounter::   ICD-10-CM   1. Vaginal discharge  N89.8 GC/Chlamydia Probe Amp    Established relevant diagnosis(es):   Plan/Recommendations: Meds ordered this encounter  Medications  . metroNIDAZOLE (METROGEL VAGINAL) 0.75 % vaginal gel    Sig: Nightly x 5 nights    Dispense:  70 g    Refill:  0    Labs or Scans Ordered: Orders Placed This Encounter  Procedures  . GC/Chlamydia Probe Amp    Management:: >metrogel vaginal  Follow up Return if symptoms worsen or fail to improve.      All questions were answered.

## 2018-12-31 LAB — GC/CHLAMYDIA PROBE AMP
Chlamydia trachomatis, NAA: NEGATIVE
Neisseria Gonorrhoeae by PCR: NEGATIVE

## 2019-01-05 ENCOUNTER — Other Ambulatory Visit: Payer: Self-pay

## 2019-01-05 ENCOUNTER — Telehealth (INDEPENDENT_AMBULATORY_CARE_PROVIDER_SITE_OTHER): Payer: No Typology Code available for payment source | Admitting: Family Medicine

## 2019-01-05 DIAGNOSIS — N898 Other specified noninflammatory disorders of vagina: Secondary | ICD-10-CM

## 2019-01-05 NOTE — Progress Notes (Signed)
Virtual Visit via Video Note  I connected with Sonia Gibson  on 01/05/19 at 12:20 PM EDT by a video enabled telemedicine application and verified that I am speaking with the correct person using two identifiers.  Location patient: car Location provider:work or home office Persons participating in the virtual visit: patient, provider   HPI:  Acute visit for vaginal discharge: -reports saw her gyn doctor for an in person exam and pelvic exam last week -reports was treated for BV with a vaginal gel -she received a call that all of her testing from that visit for STIs and discharge was negative -yesterday her period started and she had a little bit of mildly discolored discharge with blood, followed by normal period  -today not symptoms -no pain, pelvic discomfort or symptoms today  ROS: See pertinent positives and negatives per HPI.  Past Medical History:  Diagnosis Date  . BV (bacterial vaginosis) 05/03/2014  . Medical history non-contributory   . Vaginal discharge 05/03/2014    Past Surgical History:  Procedure Laterality Date  . NO PAST SURGERIES      No family history on file.  SOCIAL HX: see hpi   Current Outpatient Medications:  .  metroNIDAZOLE (METROGEL VAGINAL) 0.75 % vaginal gel, Nightly x 5 nights, Disp: 70 g, Rfl: 0 .  valACYclovir (VALTREX) 1000 MG tablet, Take 1 tablet (1,000 mg total) by mouth daily., Disp: 90 tablet, Rfl: 3  EXAM:  VITALS per patient if applicable: no reported fevers  GENERAL: alert, oriented, appears well and in no acute distress  HEENT: atraumatic, conjunttiva clear, no obvious abnormalities on inspection of external nose and ears  NECK: normal movements of the head and neck  LUNGS: on inspection no signs of respiratory distress, breathing rate appears normal, no obvious gross SOB, gasping or wheezing  CV: no obvious cyanosis  MS: moves all visible extremities without noticeable abnormality  PSYCH/NEURO: pleasant and cooperative, no  obvious depression or anxiety, speech and thought processing grossly intact  ASSESSMENT AND PLAN:  Discussed the following assessment and plan:  Vaginal discharge  -we discussed possible  and likely etiologies, workup and treatment, treatment risks and return precautions. Suspect the discharge yesterday was a combination of her period starting and from using the gel. Advised to contact her gyn specialist if recurs, worsening or new concerns. -after this discussion, Samaa agreed and agrees to return or notify a doctor immediately if symptoms worsen or persist or new concerns arise.   I discussed the assessment and treatment plan with the patient. The patient was provided an opportunity to ask questions and all were answered. The patient agreed with the plan and demonstrated an understanding of the instructions.   Lucretia Kern, DO

## 2019-01-25 ENCOUNTER — Telehealth: Payer: Self-pay | Admitting: *Deleted

## 2019-01-25 NOTE — Telephone Encounter (Signed)
Pt wants to start the birth control patch. Please advise. Thanks!! [place]

## 2019-01-25 NOTE — Telephone Encounter (Signed)
Pt left message stating that she would like to have the birth control patch called in for her. She is currently not on birth control. [facility].

## 2019-01-26 ENCOUNTER — Telehealth: Payer: Self-pay | Admitting: Obstetrics & Gynecology

## 2019-01-26 MED ORDER — NORELGESTROMIN-ETH ESTRADIOL 150-35 MCG/24HR TD PTWK: 1.0000 | MEDICATED_PATCH | TRANSDERMAL | 12 refills | Status: DC

## 2019-01-26 MED FILL — XULANE PATCH: 150-35 | 28 days supply | Qty: 3 | Fill #0

## 2019-01-26 NOTE — Telephone Encounter (Signed)
refilled 

## 2019-02-24 ENCOUNTER — Ambulatory Visit: Payer: No Typology Code available for payment source

## 2019-02-28 ENCOUNTER — Ambulatory Visit (INDEPENDENT_AMBULATORY_CARE_PROVIDER_SITE_OTHER): Payer: No Typology Code available for payment source | Admitting: Family Medicine

## 2019-02-28 ENCOUNTER — Encounter: Payer: Self-pay | Admitting: Family Medicine

## 2019-02-28 ENCOUNTER — Other Ambulatory Visit: Payer: Self-pay

## 2019-02-28 VITALS — BP 104/70 | HR 92 | Temp 97.4°F | Resp 16 | Ht 64.0 in | Wt 114.5 lb

## 2019-02-28 DIAGNOSIS — Z049 Encounter for examination and observation for unspecified reason: Secondary | ICD-10-CM

## 2019-02-28 DIAGNOSIS — N643 Galactorrhea not associated with childbirth: Secondary | ICD-10-CM

## 2019-02-28 NOTE — Patient Instructions (Signed)
Galactorrhea Galactorrhea is an abnormal milky discharge from the breast. The discharge may come from one or both nipples. The fluid is often white, yellow, or green. It is different from the normal milk produced in nursing mothers. Galactorrhea usually occurs in women, but it can sometimes affect men. Various things can cause galactorrhea, such as:  Irritation of the breast, which can result from injury, stimulation during sexual activity, or clothes rubbing against the nipple.  Medicines.  Changes in hormone levels. In many cases, galactorrhea will go away without treatment. However, galactorrhea can also be a sign of something more serious, such as diseases of the kidney or thyroid, or problems with the pituitary gland. Your health care provider may do various tests to help determine the cause. Sometimes the cause is unknown. It is important to monitor your condition to make sure that it goes away. Follow these instructions at home:  Breast care  Watch your condition for any changes.  Do not squeeze your breasts or nipples.  Avoid breast stimulation during sexual activity.  Perform a breast self-exam once a month. Doing this more often can irritate your breasts.  Avoid clothes that rub on your nipples.  Use breast pads to absorb the discharge.  Wear a breast binder or a support bra to help prevent clothes from rubbing on your nipples. General instructions  Take over-the-counter and prescription medicines only as told by your health care provider.  Keep all follow-up visits as told by your health care provider. This is important. Contact a health care provider if you:  Develop hot flashes, vaginal dryness, or a lack of sexual desire.  Stop having menstrual periods, or they are irregular or far apart.  Have headaches.  Have vision problems. Get help right away if you:  Have breast discharge that is bloody or pus-like.  Have breast pain.  Feel a lump in your breast.   Have wrinkling or dimpling on your breast.  Notice that your breast becomes red and swollen. Summary  Galactorrhea is an abnormal milky discharge from the breast. The fluid may come from one or both nipples and is often white, yellow, or green.  Galactorrhea may be caused by various things, such as irritation of the nipples, medicines, or changes in hormone levels.  Galactorrhea often goes away without treatment. However, it also may be a sign of something more serious, such as diseases of the kidney or thyroid, or problems with the pituitary gland.  Get help right away if you have discharge that is bloody or pus-like, if you have breast pain or a lump, or if you have skin changes on your breast. This information is not intended to replace advice given to you by your health care provider. Make sure you discuss any questions you have with your health care provider. Document Released: 06/11/2004 Document Revised: 04/16/2017 Document Reviewed: 03/31/2017 Elsevier Patient Education  2020 Elsevier Inc.  

## 2019-02-28 NOTE — Progress Notes (Signed)
  Subjective:     Patient ID: Sonia Gibson, female   DOB: 08-02-1990, 28 y.o.   MRN: [id number]  HPI Sonia Gibson is here basically with concerns about whether she may have a foreign body in her vagina.  She started her last menstrual period on 02/24/2019.  Usually last 4 to 5 days.  She was unsure if she may have left a tampon in.  She could not feel an actual tampon.  No pelvic pain.  No discharge other than some mild spotting.  No fevers or chills.  Second issue is that she mentions that she has had some mild bilateral clear galactorrhea from both breast ever since she had her child 5 years ago.  No bloody discharge.  No skin changes.  No mass.  Her mother apparently had similar issue.  She denies any headaches.  Past Medical History:  Diagnosis Date  . BV (bacterial vaginosis) 05/03/2014  . Medical history non-contributory   . Vaginal discharge 05/03/2014   Past Surgical History:  Procedure Laterality Date  . NO PAST SURGERIES      reports that she has never smoked. She has never used smokeless tobacco. She reports that she does not drink alcohol or use drugs. family history is not on file. No Known Allergies   Review of Systems  Constitutional: Negative for appetite change, chills, fever and unexpected weight change.  Eyes: Negative for visual disturbance.  Gastrointestinal: Negative for abdominal pain.  Genitourinary: Negative for dysuria, pelvic pain, vaginal discharge and vaginal pain.  Neurological: Negative for headaches.       Objective:   Physical Exam Constitutional:      Appearance: Normal appearance.  Cardiovascular:     Rate and Rhythm: Normal rate and regular rhythm.  Genitourinary:    Comments: Normal external genitalia.  Vaginal mucosa appears normal.  She has minimal spotting.  No foreign body noted in the vaginal vault.  Cervix appears normal. Neurological:     Mental Status: She is alert.        Assessment:     #1 patient concern for foreign body vaginal  vault but none noted on exam.  Reassurance given  #2 reported 5-year history of some bilateral mild galactorrhea    Plan:     -We recommended bilateral breast exam but patient states she was in a hurry to get back to work and would not stay.  We explained that her galactorrhea based on history is likely physiologic and not worrisome but do recommend breast exam and also consideration for prolactin level if symptoms persist.  She will discuss with her primary  -Reassurance that no foreign body noted in vaginal vault  Eulas Post MD [facility] at [facility]

## 2019-04-03 ENCOUNTER — Telehealth: Payer: No Typology Code available for payment source | Admitting: Physician Assistant

## 2019-04-03 DIAGNOSIS — R42 Dizziness and giddiness: Secondary | ICD-10-CM

## 2019-04-03 NOTE — Progress Notes (Signed)
Based on what you shared with me about your dizziness, nausea, hyperventilation, and loss of appetite, I feel your condition warrants further evaluation and I recommend that you be seen for a face to face office visit so that you can have a physical exam completed.   NOTE: If you entered your credit card information for this eVisit, you will not be charged. You may see a "hold" on your card for the $35 but that hold will drop off and you will not have a charge processed.   If you are having a true medical emergency please call 911.      For an urgent face to face visit, [facility] has five urgent care centers for your convenience:      NEW:  [facility] [place] [facility] [place] Get Driving Directions [phone] [address] [place], [place] [address] . 10 am - 6pm Monday - Friday    [facility]) Get Driving Directions [phone] [address] [place], [place] [address] . 10 am to 8 pm Monday-Friday . 12 pm to 8 pm [facility] at [facility] Get Driving Directions [phone] [place], [place], [place] [address] . 8 am to 8 pm Monday-Friday . 9 am to 6 pm Saturday . 11 am to 6 pm Sunday     [facility] at [facility] Get Driving Directions  [phone] [address] [place], [place] [address] . 8 am to 8 pm Monday-Friday . 8 am to 4 pm [facility] at [place] Get Driving Directions [phone] [address] [place], [place] [address] . 12 pm to 6 pm Monday-Friday      Your e-visit answers were reviewed by a board certified advanced clinical practitioner to complete your personal care plan.  Thank you for using e-Visits.    Approximately 5 minutes was spent documenting and reviewing patient's chart.

## 2019-04-20 ENCOUNTER — Other Ambulatory Visit: Payer: Self-pay | Admitting: Women's Health

## 2019-04-24 ENCOUNTER — Encounter: Payer: Self-pay | Admitting: Family Medicine

## 2019-04-24 MED FILL — valACYclovir HCL 1 GM TABS: 1 | 90 days supply | Qty: 90 | Fill #0

## 2019-05-05 ENCOUNTER — Ambulatory Visit: Payer: No Typology Code available for payment source | Admitting: Family Medicine

## 2019-05-10 ENCOUNTER — Telehealth (INDEPENDENT_AMBULATORY_CARE_PROVIDER_SITE_OTHER): Payer: No Typology Code available for payment source | Admitting: Family Medicine

## 2019-05-10 DIAGNOSIS — U071 COVID-19: Secondary | ICD-10-CM | POA: Diagnosis not present

## 2019-05-10 NOTE — Progress Notes (Signed)
Virtual Visit via Video Note  I connected with Ayanni Tun on 05/10/19 at  2:30 PM EST by a video enabled telemedicine application 2/2 [id number]-31 pandemic and verified that I am speaking with the correct person using two identifiers.  Location patient: home Location provider:work or home office Persons participating in the virtual visit: patient, provider  I discussed the limitations of evaluation and management by telemedicine and the availability of in person appointments. The patient expressed understanding and agreed to proceed.   HPI: Pt tested positive for COVID-19 on 12/8.  At the time pt was weak, lightheaded, fatigued, had emesis, cough, rhinorrhea, HAs.  Pt did not have a fever.  She is still having HAs, rhinorrhea, and fatigue. Taking tylenol prn. Pt is schedule to go back to work on 12/28 in a new department.  Pt completed FMLA papers.   ROS: See pertinent positives and negatives per HPI.  Past Medical History:  Diagnosis Date  . BV (bacterial vaginosis) 05/03/2014  . Medical history non-contributory   . Vaginal discharge 05/03/2014    Past Surgical History:  Procedure Laterality Date  . NO PAST SURGERIES      No family history on file.   Current Outpatient Medications:  .  metroNIDAZOLE (METROGEL VAGINAL) 0.75 % vaginal gel, Nightly x 5 nights (Patient not taking: Reported on 02/28/2019), Disp: 70 g, Rfl: 0 .  norelgestromin-ethinyl estradiol (ORTHO EVRA) 150-35 MCG/24HR transdermal patch, Place 1 patch onto the skin once a week. (Patient not taking: Reported on 02/28/2019), Disp: 3 patch, Rfl: 12 .  valACYclovir (VALTREX) 1000 MG tablet, TAKE 1 TABLET BY MOUTH ONCE DAILY, Disp: 90 tablet, Rfl: 3  EXAM:  VITALS per patient if applicable: RR between 51-76 bpm  GENERAL: alert, oriented, appears well and in no acute distress  HEENT: atraumatic, conjunctiva clear, no obvious abnormalities on inspection of external nose and ears  NECK: normal movements of the head  and neck  LUNGS: on inspection no signs of respiratory distress, breathing rate appears normal, no obvious gross SOB, gasping or wheezing  CV: no obvious cyanosis  MS: moves all visible extremities without noticeable abnormality  PSYCH/NEURO: pleasant and cooperative, no obvious depression or anxiety, speech and thought processing grossly intact  ASSESSMENT AND PLAN:  Discussed the following assessment and plan:  COVID-19 virus infection -dx'd on 12/8.  improving, but still with fatigue, HAs, rhinorrhea.   -continue supportive care -discussed completing quarantine.  Will assess how pt is feeling for possible return to work on 12/28. -will complete forms as needed -given precutions  F/u prn   I discussed the assessment and treatment plan with the patient. The patient was provided an opportunity to ask questions and all were answered. The patient agreed with the plan and demonstrated an understanding of the instructions.   The patient was advised to call back or seek an in-person evaluation if the symptoms worsen or if the condition fails to improve as anticipated.   Billie Ruddy, MD

## 2019-05-18 ENCOUNTER — Telehealth: Payer: Self-pay

## 2019-05-18 NOTE — Telephone Encounter (Signed)
Spoke with pt aware that her FMLA forms have been completed and faxed to [employer], pt verbalized understanding

## 2019-08-01 ENCOUNTER — Telehealth (INDEPENDENT_AMBULATORY_CARE_PROVIDER_SITE_OTHER): Payer: No Typology Code available for payment source | Admitting: Family Medicine

## 2019-08-01 DIAGNOSIS — Z113 Encounter for screening for infections with a predominantly sexual mode of transmission: Secondary | ICD-10-CM

## 2019-08-01 NOTE — Progress Notes (Signed)
Virtual Visit via Video Note  I connected with Sonia Gibson on 08/01/19 at  2:30 PM EDT by a video enabled telemedicine application 2/2 COVID-19 pandemic and verified that I am speaking with the correct person using two identifiers.  Location patient: home Location provider:work or home office Persons participating in the virtual visit: patient, provider  I discussed the limitations of evaluation and management by telemedicine and the availability of in person appointments. The patient expressed understanding and agreed to proceed.   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  Pt's FOB and his current partner deny any symptoms then or now.  Pt never had any lesions, outbreaks, or cold sores.  Pt inquires about retesting.     ROS: See pertinent positives and negatives per HPI.  Past Medical History:  Diagnosis Date  . BV (bacterial vaginosis) 05/03/2014  . Medical history non-contributory   . Vaginal discharge 05/03/2014    Past Surgical History:  Procedure Laterality Date  . NO PAST SURGERIES      No family history on file.    Current Outpatient Medications:  .  metroNIDAZOLE (METROGEL VAGINAL) 0.75 % vaginal gel, Nightly x 5 nights (Patient not taking: Reported on 02/28/2019), Disp: 70 g, Rfl: 0 .  norelgestromin-ethinyl estradiol (ORTHO EVRA) 150-35 MCG/24HR transdermal patch, Place 1 patch onto the skin once a week. (Patient not taking: Reported on 02/28/2019), Disp: 3 patch, Rfl: 12 .  valACYclovir (VALTREX) 1000 MG tablet, TAKE 1 TABLET BY MOUTH ONCE DAILY, Disp: 90 tablet, Rfl: 3  EXAM:  VITALS per patient if applicable:  RR between 12-20 bpm  GENERAL: alert, oriented, appears well and in no acute distress  HEENT: atraumatic, conjunctiva clear, no obvious abnormalities on inspection of external nose and ears  NECK: normal movements of the head and neck  LUNGS: on inspection no signs of respiratory distress, breathing rate appears normal,  no obvious gross SOB, gasping or wheezing  CV: no obvious cyanosis  MS: moves all visible extremities without noticeable abnormality  PSYCH/NEURO: pleasant and cooperative, no obvious depression or anxiety, speech and thought processing grossly intact  ASSESSMENT AND PLAN:  Discussed the following assessment and plan:  Routine screening for STI (sexually transmitted infection)  - Plan: RPR, HIV antibody (with reflex), HSV(herpes simplex vrs) 1+2 ab-IgG, Urine cytology ancillary only  F/u prn   I discussed the assessment and treatment plan with the patient. The patient was provided an opportunity to ask questions and all were answered. The patient agreed with the plan and demonstrated an understanding of the instructions.   The patient was advised to call back or seek an in-person evaluation if the symptoms worsen or if the condition fails to improve as anticipated.   Deeann Saint, MD

## 2019-08-02 ENCOUNTER — Other Ambulatory Visit: Payer: Self-pay

## 2019-08-02 ENCOUNTER — Other Ambulatory Visit (INDEPENDENT_AMBULATORY_CARE_PROVIDER_SITE_OTHER): Payer: No Typology Code available for payment source

## 2019-08-02 ENCOUNTER — Other Ambulatory Visit (HOSPITAL_COMMUNITY)
Admission: RE | Admit: 2019-08-02 | Discharge: 2019-08-02 | Disposition: A | Discharge status: Home / Self Care | Payer: No Typology Code available for payment source | Source: Ambulatory Visit | Attending: Family Medicine | Admitting: Family Medicine

## 2019-08-02 DIAGNOSIS — Z113 Encounter for screening for infections with a predominantly sexual mode of transmission: Secondary | ICD-10-CM

## 2019-08-03 LAB — HSV(HERPES SIMPLEX VRS) I + II AB-IGG
HAV 1 IGG,TYPE SPECIFIC AB: 57.1 index — ABNORMAL HIGH
HSV 2 IGG,TYPE SPECIFIC AB: 2.4 index — ABNORMAL HIGH

## 2019-08-03 LAB — RPR: RPR Ser Ql: NONREACTIVE

## 2019-08-03 LAB — HIV ANTIBODY (ROUTINE TESTING W REFLEX): HIV 1&2 Ab, 4th Generation: NONREACTIVE

## 2019-08-04 LAB — URINE CYTOLOGY ANCILLARY ONLY
Chlamydia: NEGATIVE
Comment: NEGATIVE
Comment: NORMAL
Neisseria Gonorrhea: NEGATIVE

## 2019-08-18 MED FILL — valACYclovir HCL 1 GM TABS: 1 | 90 days supply | Qty: 90 | Fill #1

## 2019-09-14 ENCOUNTER — Encounter: Payer: Self-pay | Admitting: Family Medicine

## 2019-09-14 ENCOUNTER — Other Ambulatory Visit: Payer: Self-pay

## 2019-09-14 ENCOUNTER — Ambulatory Visit (INDEPENDENT_AMBULATORY_CARE_PROVIDER_SITE_OTHER): Payer: No Typology Code available for payment source | Admitting: Family Medicine

## 2019-09-14 VITALS — BP 118/78 | HR 70 | Temp 97.0°F | Wt 108.0 lb

## 2019-09-14 DIAGNOSIS — F321 Major depressive disorder, single episode, moderate: Secondary | ICD-10-CM | POA: Diagnosis not present

## 2019-09-14 DIAGNOSIS — Z0000 Encounter for general adult medical examination without abnormal findings: Secondary | ICD-10-CM

## 2019-09-14 DIAGNOSIS — F419 Anxiety disorder, unspecified: Secondary | ICD-10-CM

## 2019-09-14 DIAGNOSIS — Z0001 Encounter for general adult medical examination with abnormal findings: Secondary | ICD-10-CM | POA: Diagnosis not present

## 2019-09-14 DIAGNOSIS — Z13220 Encounter for screening for lipoid disorders: Secondary | ICD-10-CM

## 2019-09-14 LAB — BASIC METABOLIC PANEL
BUN: 8 mg/dL (ref 6–23)
CO2: 31 mEq/L (ref 19–32)
Calcium: 9.2 mg/dL (ref 8.4–10.5)
Chloride: 103 mEq/L (ref 96–112)
Creatinine, Ser: 0.7 mg/dL (ref 0.40–1.20)
GFR: 118.84 mL/min (ref >60.00)
Glucose, Bld: 77 mg/dL (ref 70–99)
Potassium: 3.9 mEq/L (ref 3.5–5.1)
Sodium: 140 mEq/L (ref 135–145)

## 2019-09-14 LAB — CBC WITH DIFFERENTIAL/PLATELET
Basophils Absolute: 0 10*3/uL (ref 0.0–0.1)
Basophils Relative: 0.7 % (ref 0.0–3.0)
Eosinophils Absolute: 0.1 10*3/uL (ref 0.0–0.7)
Eosinophils Relative: 1.4 % (ref 0.0–5.0)
HCT: 42.6 % (ref 36.0–46.0)
Hemoglobin: 14.1 g/dL (ref 12.0–15.0)
Lymphocytes Relative: 25.1 % (ref 12.0–46.0)
Lymphs Abs: 1.4 10*3/uL (ref 0.7–4.0)
MCHC: 33.1 g/dL (ref 30.0–36.0)
MCV: 97.6 fl (ref 78.0–100.0)
Monocytes Absolute: 0.4 10*3/uL (ref 0.1–1.0)
Monocytes Relative: 6.4 % (ref 3.0–12.0)
Neutro Abs: 3.7 10*3/uL (ref 1.4–7.7)
Neutrophils Relative %: 66.4 % (ref 43.0–77.0)
Platelets: 198 10*3/uL (ref 150.0–400.0)
RBC: 4.36 Mil/uL (ref 3.87–5.11)
RDW: 13.4 % (ref 11.5–15.5)
WBC: 5.5 10*3/uL (ref 4.0–10.5)

## 2019-09-14 LAB — LIPID PANEL
Cholesterol: 149 mg/dL (ref 0–200)
HDL: 48.8 mg/dL (ref >39.00)
LDL Cholesterol: 89 mg/dL (ref 0–99)
NonHDL: 100.07
Total CHOL/HDL Ratio: 3
Triglycerides: 56 mg/dL (ref 0.0–149.0)
VLDL: 11.2 mg/dL (ref 0.0–40.0)

## 2019-09-14 LAB — HEMOGLOBIN A1C: Hgb A1c MFr Bld: 5.2 % (ref 4.6–6.5)

## 2019-09-14 LAB — T4, FREE: Free T4: 0.9 ng/dL (ref 0.60–1.60)

## 2019-09-14 LAB — TSH: TSH: 1.91 u[IU]/mL (ref 0.35–4.50)

## 2019-09-14 MED ORDER — PAROXETINE HCL 20 MG PO TABS: 20.0000 mg | ORAL_TABLET | Freq: Every day | ORAL | 1 refills | Status: DC

## 2019-09-14 MED FILL — PARoxetine HCL 20 MG TABS: 20 | 45 days supply | Qty: 45 | Fill #0

## 2019-09-14 NOTE — Patient Instructions (Addendum)
[email or web address]  Preventive Care 86-30 Years Old, Female Preventive care refers to visits with your health care provider and lifestyle choices that can promote health and wellness. This includes:  A yearly physical exam. This may also be called an annual well check.  Regular dental visits and eye exams.  Immunizations.  Screening for certain conditions.  Healthy lifestyle choices, such as eating a healthy diet, getting regular exercise, not using drugs or products that contain nicotine and tobacco, and limiting alcohol use. What can I expect for my preventive care visit? Physical exam Your health care provider will check your:  Height and weight. This may be used to calculate body mass index (BMI), which tells if you are at a healthy weight.  Heart rate and blood pressure.  Skin for abnormal spots. Counseling Your health care provider may ask you questions about your:  Alcohol, tobacco, and drug use.  Emotional well-being.  Home and relationship well-being.  Sexual activity.  Eating habits.  Work and work Statistician.  Method of birth control.  Menstrual cycle.  Pregnancy history. What immunizations do I need?  Influenza (flu) vaccine  This is recommended every year. Tetanus, diphtheria, and pertussis (Tdap) vaccine  You may need a Td booster every 10 years. Varicella (chickenpox) vaccine  You may need this if you have not been vaccinated. Human papillomavirus (HPV) vaccine  If recommended by your health care provider, you may need three doses over 6 months. Measles, mumps, and rubella (MMR) vaccine  You may need at least one dose of MMR. You may also need a second dose. Meningococcal conjugate (MenACWY) vaccine  One dose is recommended if you are age 42-21 years and a first-year college student living in a residence hall, or if you have one of several medical conditions. You may also need additional booster doses. Pneumococcal  conjugate (PCV13) vaccine  You may need this if you have certain conditions and were not previously vaccinated. Pneumococcal polysaccharide (PPSV23) vaccine  You may need one or two doses if you smoke cigarettes or if you have certain conditions. Hepatitis A vaccine  You may need this if you have certain conditions or if you travel or work in places where you may be exposed to hepatitis A. Hepatitis B vaccine  You may need this if you have certain conditions or if you travel or work in places where you may be exposed to hepatitis B. Haemophilus influenzae type b (Hib) vaccine  You may need this if you have certain conditions. You may receive vaccines as individual doses or as more than one vaccine together in one shot (combination vaccines). Talk with your health care provider about the risks and benefits of combination vaccines. What tests do I need?  Blood tests  Lipid and cholesterol levels. These may be checked every 5 years starting at age 64.  Hepatitis C test.  Hepatitis B test. Screening  Diabetes screening. This is done by checking your blood sugar (glucose) after you have not eaten for a while (fasting).  Sexually transmitted disease (STD) testing.  BRCA-related cancer screening. This may be done if you have a family history of breast, ovarian, tubal, or peritoneal cancers.  Pelvic exam and Pap test. This may be done every 3 years starting at age 65. Starting at age 41, this may be done every 5 years if you have a Pap test in combination with an HPV test. Talk with your health care provider about your test results, treatment options, and if necessary, the  need for more tests. Follow these instructions at home: Eating and drinking   Eat a diet that includes fresh fruits and vegetables, whole grains, lean protein, and low-fat dairy.  Take vitamin and mineral supplements as recommended by your health care provider.  Do not drink alcohol if: ? Your health care  provider tells you not to drink. ? You are pregnant, may be pregnant, or are planning to become pregnant.  If you drink alcohol: ? Limit how much you have to 0-1 drink a day. ? Be aware of how much alcohol is in your drink. In the [place] one drink equals one 12 oz bottle of beer (355 mL), one 5 oz glass of wine (148 mL), or one 1 oz glass of hard liquor (44 mL). Lifestyle  Take daily care of your teeth and gums.  Stay active. Exercise for at least 30 minutes on 5 or more days each week.  Do not use any products that contain nicotine or tobacco, such as cigarettes, e-cigarettes, and chewing tobacco. If you need help quitting, ask your health care provider.  If you are sexually active, practice safe sex. Use a condom or other form of birth control (contraception) in order to prevent pregnancy and STIs (sexually transmitted infections). If you plan to become pregnant, see your health care provider for a preconception visit. What's next?  Visit your health care provider once a year for a well check visit.  Ask your health care provider how often you should have your eyes and teeth checked.  Stay up to date on all vaccines. This information is not intended to replace advice given to you by your health care provider. Make sure you discuss any questions you have with your health care provider. Document Revised: 01/13/2018 Document Reviewed: 01/13/2018 Elsevier Patient Education  [place], Adult After being diagnosed with an anxiety disorder, you may be relieved to know why you have felt or behaved a certain way. You may also feel overwhelmed about the treatment ahead and what it will mean for your life. With care and support, you can manage this condition and recover from it. How to manage lifestyle changes Managing stress and anxiety  Stress is your body's reaction to life changes and events, both good and bad. Most stress will last just a few hours, but stress  can be ongoing and can lead to more than just stress. Although stress can play a major role in anxiety, it is not the same as anxiety. Stress is usually caused by something external, such as a deadline, test, or competition. Stress normally passes after the triggering event has ended.  Anxiety is caused by something internal, such as imagining a terrible outcome or worrying that something will go wrong that will devastate you. Anxiety often does not go away even after the triggering event is over, and it can become long-term (chronic) worry. It is important to understand the differences between stress and anxiety and to manage your stress effectively so that it does not lead to an anxious response. Talk with your health care provider or a counselor to learn more about reducing anxiety and stress. He or she may suggest tension reduction techniques, such as:  Music therapy. This can include creating or listening to music that you enjoy and that inspires you.  Mindfulness-based meditation. This involves being aware of your normal breaths while not trying to control your breathing. It can be done while sitting or walking.  Centering prayer. This involves  focusing on a word, phrase, or sacred image that means something to you and brings you peace.  Deep breathing. To do this, expand your stomach and inhale slowly through your nose. Hold your breath for 3-5 seconds. Then exhale slowly, letting your stomach muscles relax.  Self-talk. This involves identifying thought patterns that lead to anxiety reactions and changing those patterns.  Muscle relaxation. This involves tensing muscles and then relaxing them. Choose a tension reduction technique that suits your lifestyle and personality. These techniques take time and practice. Set aside 5-15 minutes a day to do them. Therapists can offer counseling and training in these techniques. The training to help with anxiety may be covered by some insurance plans.  Other things you can do to manage stress and anxiety include:  Keeping a stress/anxiety diary. This can help you learn what triggers your reaction and then learn ways to manage your response.  Thinking about how you react to certain situations. You may not be able to control everything, but you can control your response.  Making time for activities that help you relax and not feeling guilty about spending your time in this way.  Visual imagery and yoga can help you stay calm and relax.  Medicines Medicines can help ease symptoms. Medicines for anxiety include:  Anti-anxiety drugs.  Antidepressants. Medicines are often used as a primary treatment for anxiety disorder. Medicines will be prescribed by a health care provider. When used together, medicines, psychotherapy, and tension reduction techniques may be the most effective treatment. Relationships Relationships can play a big part in helping you recover. Try to spend more time connecting with trusted friends and family members. Consider going to couples counseling, taking family education classes, or going to family therapy. Therapy can help you and others better understand your condition. How to recognize changes in your anxiety Everyone responds differently to treatment for anxiety. Recovery from anxiety happens when symptoms decrease and stop interfering with your daily activities at home or work. This may mean that you will start to:  Have better concentration and focus. Worry will interfere less in your daily thinking.  Sleep better.  Be less irritable.  Have more energy.  Have improved memory. It is important to recognize when your condition is getting worse. Contact your health care provider if your symptoms interfere with home or work and you feel like your condition is not improving. Follow these instructions at home: Activity  Exercise. Most adults should do the following: ? Exercise for at least 150 minutes each week.  The exercise should increase your heart rate and make you sweat (moderate-intensity exercise). ? Strengthening exercises at least twice a week.  Get the right amount and quality of sleep. Most adults need 7-9 hours of sleep each night. Lifestyle   Eat a healthy diet that includes plenty of vegetables, fruits, whole grains, low-fat dairy products, and lean protein. Do not eat a lot of foods that are high in solid fats, added sugars, or salt.  Make choices that simplify your life.  Do not use any products that contain nicotine or tobacco, such as cigarettes, e-cigarettes, and chewing tobacco. If you need help quitting, ask your health care provider.  Avoid caffeine, alcohol, and certain over-the-counter cold medicines. These may make you feel worse. Ask your pharmacist which medicines to avoid. General instructions  Take over-the-counter and prescription medicines only as told by your health care provider.  Keep all follow-up visits as told by your health care provider. This is important. Where  to find support You can get help and support from these sources:  Self-help groups.  Online and [employer].  A trusted spiritual leader.  Couples counseling.  Family education classes.  Family therapy. Where to find more information You may find that joining a support group helps you deal with your anxiety. The following sources can help you locate counselors or support groups near you:  [place]: www.mentalhealthamerica.net  Anxiety and Depression Association of [place] (ADAA): https://www.clark.net/  National Alliance on Mental Illness (NAMI): www.nami.org Contact a health care provider if you:  Have a hard time staying focused or finishing daily tasks.  Spend many hours a day feeling worried about everyday life.  Become exhausted by worry.  Start to have headaches, feel tense, or have nausea.  Urinate more than normal.  Have diarrhea. Get help right away  if you have:  A racing heart and shortness of breath.  Thoughts of hurting yourself or others. If you ever feel like you may hurt yourself or others, or have thoughts about taking your own life, get help right away. You can go to your nearest emergency department or call:  Your local emergency services (911 in the [place]  A suicide crisis helpline, such as the [facility] at 913-885-1632. This is open 24 hours a day. Summary  Taking steps to learn and use tension reduction techniques can help calm you and help prevent triggering an anxiety reaction.  When used together, medicines, psychotherapy, and tension reduction techniques may be the most effective treatment.  Family, friends, and partners can play a big part in helping you recover from an anxiety disorder. This information is not intended to replace advice given to you by your health care provider. Make sure you discuss any questions you have with your health care provider. Document Revised: 10/04/2018 Document Reviewed: 10/04/2018 Elsevier Patient Education  [place] With Depression Everyone experiences occasional disappointment, sadness, and loss in their lives. When you are feeling down, blue, or sad for at least 2 weeks in a row, it may mean that you have depression. Depression can affect your thoughts and feelings, relationships, daily activities, and physical health. It is caused by changes in the way your brain functions. If you receive a diagnosis of depression, your health care provider will tell you which type of depression you have and what treatment options are available to you. If you are living with depression, there are ways to help you recover from it and also ways to prevent it from coming back. How to cope with lifestyle changes Coping with stress     Stress is your body's reaction to life changes and events, both good and bad. Stressful situations may  include:  Getting married.  The death of a spouse.  Losing a job.  Retiring.  Having a baby. Stress can last just a few hours or it can be ongoing. Stress can play a major role in depression, so it is important to learn both how to cope with stress and how to think about it differently. Talk with your health care provider or a counselor if you would like to learn more about stress reduction. He or she may suggest some stress reduction techniques, such as:  Music therapy. This can include creating music or listening to music. Choose music that you enjoy and that inspires you.  Mindfulness-based meditation. This kind of meditation can be done while sitting or walking. It involves being aware of your normal  breaths, rather than trying to control your breathing.  Centering prayer. This is a kind of meditation that involves focusing on a spiritual word or phrase. Choose a word, phrase, or sacred image that is meaningful to you and that brings you peace.  Deep breathing. To do this, expand your stomach and inhale slowly through your nose. Hold your breath for 3-5 seconds, then exhale slowly, allowing your stomach muscles to relax.  Muscle relaxation. This involves intentionally tensing muscles then relaxing them. Choose a stress reduction technique that fits your lifestyle and personality. Stress reduction techniques take time and practice to develop. Set aside 5-15 minutes a day to do them. Therapists can offer training in these techniques. The training may be covered by some insurance plans. Other things you can do to manage stress include:  Keeping a stress diary. This can help you learn what triggers your stress and ways to control your response.  Understanding what your limits are and saying no to requests or events that lead to a schedule that is too full.  Thinking about how you respond to certain situations. You may not be able to control everything, but you can control how you  react.  Adding humor to your life by watching funny films or TV shows.  Making time for activities that help you relax and not feeling guilty about spending your time this way.  Medicines Your health care provider may suggest certain medicines if he or she feels that they will help improve your condition. Avoid using alcohol and other substances that may prevent your medicines from working properly (may interact). It is also important to:  Talk with your pharmacist or health care provider about all the medicines that you take, their possible side effects, and what medicines are safe to take together.  Make it your goal to take part in all treatment decisions (shared decision-making). This includes giving input on the side effects of medicines. It is best if shared decision-making with your health care provider is part of your total treatment plan. If your health care provider prescribes a medicine, you may not notice the full benefits of it for 4-8 weeks. Most people who are treated for depression need to be on medicine for at least 6-12 months after they feel better. If you are taking medicines as part of your treatment, do not stop taking medicines without first talking to your health care provider. You may need to have the medicine slowly decreased (tapered) over time to decrease the risk of harmful side effects. Relationships Your health care provider may suggest family therapy along with individual therapy and drug therapy. While there may not be family problems that are causing you to feel depressed, it is still important to make sure your family learns as much as they can about your mental health. Having your family's support can help make your treatment successful. How to recognize changes in your condition Everyone has a different response to treatment for depression. Recovery from major depression happens when you have not had signs of major depression for two months. This may mean that you  will start to:  Have more interest in doing activities.  Feel less hopeless than you did 2 months ago.  Have more energy.  Overeat less often, or have better or improving appetite.  Have better concentration. Your health care provider will work with you to decide the next steps in your recovery. It is also important to recognize when your condition is getting worse. Watch for  these signs:  Having fatigue or low energy.  Eating too much or too little.  Sleeping too much or too little.  Feeling restless, agitated, or hopeless.  Having trouble concentrating or making decisions.  Having unexplained physical complaints.  Feeling irritable, angry, or aggressive. Get help as soon as you or your family members notice these symptoms coming back. How to get support and help from others How to talk with friends and family members about your condition  Talking to friends and family members about your condition can provide you with one way to get support and guidance. Reach out to trusted friends or family members, explain your symptoms to them, and let them know that you are working with a health care provider to treat your depression. Financial resources Not all insurance plans cover mental health care, so it is important to check with your insurance carrier. If paying for co-pays or counseling services is a problem, search for a local or county mental health care center. They may be able to offer public mental health care services at low or no cost when you are not able to see a private health care provider. If you are taking medicine for depression, you may be able to get the generic form, which may be less expensive. Some makers of prescription medicines also offer help to patients who cannot afford the medicines they need. Follow these instructions at home:   Get the right amount and quality of sleep.  Cut down on using caffeine, tobacco, alcohol, and other potentially harmful  substances.  Try to exercise, such as walking or lifting small weights.  Take over-the-counter and prescription medicines only as told by your health care provider.  Eat a healthy diet that includes plenty of vegetables, fruits, whole grains, low-fat dairy products, and lean protein. Do not eat a lot of foods that are high in solid fats, added sugars, or salt.  Keep all follow-up visits as told by your health care provider. This is important. Contact a health care provider if:  You stop taking your antidepressant medicines, and you have any of these symptoms: ? Nausea. ? Headache. ? Feeling lightheaded. ? Chills and body aches. ? Not being able to sleep (insomnia).  You or your friends and family think your depression is getting worse. Get help right away if:  You have thoughts of hurting yourself or others. If you ever feel like you may hurt yourself or others, or have thoughts about taking your own life, get help right away. You can go to your nearest emergency department or call:  Your local emergency services (911 in the [place]  A suicide crisis helpline, such as the [facility] at 806-436-2477. This is open 24-hours a day. Summary  If you are living with depression, there are ways to help you recover from it and also ways to prevent it from coming back.  Work with your health care team to create a management plan that includes counseling, stress management techniques, and healthy lifestyle habits. This information is not intended to replace advice given to you by your health care provider. Make sure you discuss any questions you have with your health care provider. Document Revised: 08/26/2018 Document Reviewed: 04/06/2016 Elsevier Patient Education  [place].

## 2019-09-14 NOTE — Progress Notes (Signed)
Subjective:     Sonia Gibson is a 29 y.o. female and is here for a acute concern and comprehensive physical exam. The patient reports problems - depression.  Patient notes increased stress at work and at home.  Tries not to associated herself with the gossip at work.  Pt endorses becoming irritable with her son, Sonia Gibson.  States her son's father is not involved that much.  Endorses frustration.  Notes tearful at times.    Social History   Socioeconomic History  . Marital status: Single    Spouse name: Not on file  . Number of children: Not on file  . Years of education: Not on file  . Highest education level: Not on file  Occupational History  . Not on file  Tobacco Use  . Smoking status: Never Smoker  . Smokeless tobacco: Never Used  Substance and Sexual Activity  . Alcohol use: No  . Drug use: No  . Sexual activity: Yes    Birth control/protection: Condom, Injection  Other Topics Concern  . Not on file  Social History Narrative  . Not on file   Social Determinants of Health   Financial Resource Strain:   . Difficulty of Paying Living Expenses:   Food Insecurity:   . Worried About Programme researcher, broadcasting/film/video in the Last Year:   . Barista in the Last Year:   Transportation Needs:   . Freight forwarder (Medical):   Marland Kitchen Lack of Transportation (Non-Medical):   Physical Activity:   . Days of Exercise per Week:   . Minutes of Exercise per Session:   Stress:   . Feeling of Stress :   Social Connections:   . Frequency of Communication with Friends and Family:   . Frequency of Social Gatherings with Friends and Family:   . Attends Religious Services:   . Active Member of Clubs or Organizations:   . Attends Banker Meetings:   Marland Kitchen Marital Status:   Intimate Partner Violence:   . Fear of Current or Ex-Partner:   . Emotionally Abused:   Marland Kitchen Physically Abused:   . Sexually Abused:    Health Maintenance  Topic Date Due  . TETANUS/TDAP  Never done  . INFLUENZA  VACCINE  12/17/2019  . PAP SMEAR-Modifier  11/01/2020  . HIV Screening  Completed    The following portions of the patient's history were reviewed and updated as appropriate: allergies, current medications, past family history, past medical history, past social history, past surgical history and problem list.  Review of Systems Pertinent items noted in HPI and remainder of comprehensive ROS otherwise negative.   Objective:    BP 118/78 (BP Location: Left Arm, Patient Position: Sitting, Cuff Size: Normal)   Pulse 70   Temp (!) 97 F (36.1 C) (Temporal)   Wt 108 lb (49 kg)   SpO2 98%   BMI 18.54 kg/m    General appearance: alert, cooperative and no distress tearful at times during visit. Head: Normocephalic, without obvious abnormality, atraumatic Eyes: conjunctivae/corneas clear. PERRL, EOM's intact. Fundi benign. Ears: normal TM's and external ear canals both ears Nose: Nares normal. Septum midline. Mucosa normal. No drainage or sinus tenderness. Throat: lips, mucosa, and tongue normal; teeth and gums normal Neck: no adenopathy, no carotid bruit, no JVD, supple, symmetrical, trachea midline and thyroid not enlarged, symmetric, no tenderness/mass/nodules Lungs: clear to auscultation bilaterally Heart: regular rate and rhythm, S1, S2 normal, no murmur, click, rub or gallop Abdomen: soft, non-tender; bowel  sounds normal; no masses,  no organomegaly Extremities: extremities normal, atraumatic, no cyanosis or edema Pulses: 2+ and symmetric Skin: Skin color, texture, turgor normal. No rashes or lesions Lymph nodes: Cervical, supraclavicular, and axillary nodes normal. Neurologic: Alert and oriented X 3, normal strength and tone. Normal symmetric reflexes. Normal coordination and gait    Assessment:    Healthy female exam.      Plan:      Anticipatory guidance given including wearing seatbelts, smoke detectors in the home, increasing physical activity, increasing p.o. intake of  water and vegetables. -will obtain labs -Pap up-to-date -Given handout -Next CPE in 1 yr See After Visit Summary for Counseling Recommendations    Anxiety  -GAD-7 score 21 -Discussed counseling options.  Patient given information.  Advised to call and schedule counseling appointment. -Given precautions -Discussed medication options.  Will start Paxil 20 mg daily -Follow-up in 4-6 weeks - Plan: TSH, T4, Free, Hemoglobin A1c, PARoxetine (PAXIL) 20 MG tablet  Depression, major, single episode, moderate (HCC)  -PHQ-9 score 20 -Discussed counseling options -Patient encouraged to call and schedule an appointment for counseling.  Given info on area providers. -We will start Paxil 20 mg daily. - Plan: TSH, T4, Free, PARoxetine (PAXIL) 20 MG tablet  Screening for cholesterol level  - Plan: Lipid panel  Follow-up in 4-6 weeks, sooner if needed  Grier Mitts, MD

## 2019-09-15 ENCOUNTER — Encounter: Payer: Self-pay | Admitting: Family Medicine

## 2019-09-22 ENCOUNTER — Other Ambulatory Visit: Payer: Self-pay

## 2019-09-25 ENCOUNTER — Encounter: Payer: Self-pay | Admitting: Family Medicine

## 2019-09-25 ENCOUNTER — Ambulatory Visit (INDEPENDENT_AMBULATORY_CARE_PROVIDER_SITE_OTHER): Payer: No Typology Code available for payment source | Admitting: Family Medicine

## 2019-09-25 ENCOUNTER — Other Ambulatory Visit: Payer: Self-pay

## 2019-09-25 VITALS — BP 98/68 | HR 77 | Temp 97.8°F | Wt 111.0 lb

## 2019-09-25 DIAGNOSIS — N907 Vulvar cyst: Secondary | ICD-10-CM | POA: Diagnosis not present

## 2019-09-25 MED ORDER — AMOXICILLIN 500 MG PO CAPS: 500.0000 mg | ORAL_CAPSULE | Freq: Two times a day (BID) | ORAL | 0 refills | Status: AC

## 2019-09-25 NOTE — Patient Instructions (Signed)
Epidermal Cyst  An epidermal cyst is a sac made of skin tissue. The sac contains a substance called keratin. Keratin is a protein that is normally secreted through the hair follicles. When keratin becomes trapped in the top layer of skin (epidermis), it can form an epidermal cyst. Epidermal cysts can be found anywhere on your body. These cysts are usually harmless (benign), and they may not cause symptoms unless they become infected. What are the causes? This condition may be caused by:  A blocked hair follicle.  A hair that curls and re-enters the skin instead of growing straight out of the skin (ingrown hair).  A blocked pore.  Irritated skin.  An injury to the skin.  Certain conditions that are passed along from parent to child (inherited).  Human papillomavirus (HPV).  Long-term (chronic) sun damage to the skin. What increases the risk? The following factors may make you more likely to develop an epidermal cyst:  Having acne.  Being overweight.  Being 30-40 years old. What are the signs or symptoms? The only symptom of this condition may be a small, painless lump underneath the skin. When an epidermal cyst ruptures, it may become infected. Symptoms may include:  Redness.  Inflammation.  Tenderness.  Warmth.  Fever.  Keratin draining from the cyst. Keratin is grayish-white, bad-smelling substance.  Pus draining from the cyst. How is this diagnosed? This condition is diagnosed with a physical exam.  In some cases, you may have a sample of tissue (biopsy) taken from your cyst to be examined under a microscope or tested for bacteria.  You may be referred to a health care provider who specializes in skin care (dermatologist). How is this treated? In many cases, epidermal cysts go away on their own without treatment. If a cyst becomes infected, treatment may include:  Opening and draining the cyst, done by a health care provider. After draining, minor surgery to  remove the rest of the cyst may be done.  Antibiotic medicine.  Injections of medicines (steroids) that help to reduce inflammation.  Surgery to remove the cyst. Surgery may be done if the cyst: ? Becomes large. ? Bothers you. ? Has a chance of turning into cancer.  Do not try to open a cyst yourself. Follow these instructions at home:  Take over-the-counter and prescription medicines only as told by your health care provider.  If you were prescribed an antibiotic medicine, take it it as told by your health care provider. Do not stop using the antibiotic even if you start to feel better.  Keep the area around your cyst clean and dry.  Wear loose, dry clothing.  Avoid touching your cyst.  Check your cyst every day for signs of infection. Check for: ? Redness, swelling, or pain. ? Fluid or blood. ? Warmth. ? Pus or a bad smell.  Keep all follow-up visits as told by your health care provider. This is important. How is this prevented?  Wear clean, dry, clothing.  Avoid wearing tight clothing.  Keep your skin clean and dry. Take showers or baths every day. Contact a health care provider if:  Your cyst develops symptoms of infection.  Your condition is not improving or is getting worse.  You develop a cyst that looks different from other cysts you have had.  You have a fever. Get help right away if:  Redness spreads from the cyst into the surrounding area. Summary  An epidermal cyst is a sac made of skin tissue. These cysts are   usually harmless (benign), and they may not cause symptoms unless they become infected.  If a cyst becomes infected, treatment may include surgery to open and drain the cyst, or to remove it. Treatment may also include medicines by mouth or through an injection.  Take over-the-counter and prescription medicines only as told by your health care provider. If you were prescribed an antibiotic medicine, take it as told by your health care  provider. Do not stop using the antibiotic even if you start to feel better.  Contact a health care provider if your condition is not improving or is getting worse.  Keep all follow-up visits as told by your health care provider. This is important. This information is not intended to replace advice given to you by your health care provider. Make sure you discuss any questions you have with your health care provider. Document Revised: 08/25/2018 Document Reviewed: 11/15/2017 Elsevier Patient Education  2020 Else[employer]  Epidermal Cyst Removal, Care After This sheet gives you information about how to care for yourself after your procedure. Your health care provider may also give you more specific instructions. If you have problems or questions, contact your health care provider. What can I expect after the procedure? After the procedure, it is common to have:  Soreness in the area where your cyst was removed.  Tightness or itchiness from the stitches (sutures) in your skin. Follow these instructions at home: Medicines  Take over-the-counter and prescription medicines only as told by your health care provider.  If you were prescribed an antibiotic medicine or ointment, take or apply it as told by your health care provider. Do not stop using the antibiotic even if you start to feel better. Incision care   Follow instructions from your health care provider about how to take care of your incision. Make sure you: ? Wash your hands with soap and water before you change your bandage (dressing). If soap and water are not available, use hand sanitizer. ? Change your dressing as told by your health care provider. ? Leave sutures, skin glue, or adhesive strips in place. These skin closures may need to stay in place for 1-2 weeks or longer. If adhesive strip edges start to loosen and curl up, you may trim the loose edges. Do not remove adhesive strips completely unless your health care provider  tells you to do that.  Keep the dressingdry until your health care provider says that it can be removed.  After your dressing is off, check your incision area every day for signs of infection. Check for: ? Redness, swelling, or pain. ? Fluid or blood. ? Warmth. ? Pus or a bad smell. General instructions  Do not take baths, swim, or use a hot tub until your health care provider approves. Ask your health care provider if you may take showers. You may only be allowed to take sponge baths.  Your health care provider may ask you to avoid contact sports or activities that take a lot of effort. Do not do anything that stretches or puts pressure on your incision.  You can return to your normal diet.  Keep all follow-up visits as told by your health care provider. This is important. Contact a health care provider if:  You have a fever.  You have redness, swelling, or pain in the incision area.  You have fluid or blood coming from your incision.  You have pus or a bad smell coming from your incision.  Your incision feels warm to   the touch.  Your cyst grows back. Summary  After the procedure, it is common to have soreness in the area where your cyst was removed.  Take or apply over-the-counter and prescription medicines only as told by your health care provider.  Follow instructions from your health care provider about how to take care of your incision. This information is not intended to replace advice given to you by your health care provider. Make sure you discuss any questions you have with your health care provider. Document Revised: 08/24/2017 Document Reviewed: 02/25/2017 Elsevier Patient Education  2020 Elsevier Inc.  

## 2019-09-25 NOTE — Progress Notes (Signed)
Subjective:    Patient ID: Sonia Gibson, female    DOB: Jun 24, 1990, 29 y.o.   MRN: [id number]  No chief complaint on file.   HPI Patient was seen today for acute concern.  Pt with 2 cysts in pelvic area.  Pt notes a small painful bump on R buttock that started a few days ago.  Pt also notes a prior h/o a cyst that was drained many yrs ago, but will often drain purulent material.  Pt denies fever, chills, n/v, edema, erythema.  Past Medical History:  Diagnosis Date  . BV (bacterial vaginosis) 05/03/2014  . Medical history non-contributory   . Vaginal discharge 05/03/2014    No Known Allergies  ROS General: Denies fever, chills, night sweats, changes in weight, changes in appetite HEENT: Denies headaches, ear pain, changes in vision, rhinorrhea, sore throat CV: Denies CP, palpitations, SOB, orthopnea Pulm: Denies SOB, cough, wheezing GI: Denies abdominal pain, nausea, vomiting, diarrhea, constipation GU: Denies dysuria, hematuria, frequency, vaginal discharge  Msk: Denies muscle cramps, joint pains Neuro: Denies weakness, numbness, tingling Skin: Denies rashes, bruising  +h/o reoccurring cyst and bump on R buttock Psych: Denies depression, anxiety, hallucinations     Objective:    Blood pressure 98/68, pulse 77, temperature 97.8 F (36.6 C), temperature source Temporal, weight 111 lb (50.3 kg), SpO2 98 %.   Gen. Pleasant, well-nourished, in no distress, normal affect   HEENT: [place]/AT, face symmetric, no scleral icterus, PERRLA, EOMI, nares patent without drainage Lungs: no accessory muscle use Cardiovascular: RRR, no peripheral edema GU: 1 cm firm, mobile mass with a 1.5 cm tract opening forming a crease in the lateral R labia majora.  A 1 cm cystic lesion on R buttock proximal to R thigh Neuro:  A&Ox3, CN II-XII intact, normal gait Skin:  Warm, no lesions/rash. 2 cystic lesions of R labia majora and R buttock   Wt Readings from Last 3 Encounters:  09/14/19 108 lb (49 kg)   02/28/19 114 lb 8 oz (51.9 kg)  12/27/18 119 lb (54 kg)    Lab Results  Component Value Date   WBC 5.5 09/14/2019   HGB 14.1 09/14/2019   HCT 42.6 09/14/2019   PLT 198.0 09/14/2019   GLUCOSE 77 09/14/2019   CHOL 149 09/14/2019   TRIG 56.0 09/14/2019   HDL 48.80 09/14/2019   LDLCALC 89 09/14/2019   ALT 7 04/15/2016   AST 14 04/15/2016   NA 140 09/14/2019   K 3.9 09/14/2019   CL 103 09/14/2019   CREATININE 0.70 09/14/2019   BUN 8 09/14/2019   CO2 31 09/14/2019   TSH 1.91 09/14/2019   HGBA1C 5.2 09/14/2019    Incision and Drainage Procedure Note  Pre-operative Diagnosis: epidermal cyst x 2  Post-operative Diagnosis: same  Indications: cyst  Anesthesia: 1% plain lidocaine  Procedure Details  The procedure, risks and complications have been discussed in detail (including, but not limited to airway compromise, infection, bleeding) with the patient, and the patient has signed consent to the procedure.  The skin was sterilely prepped and draped over the affected area in the usual fashion. After adequate local anesthesia, I&D with a #11 and 15 blade was performed on the right lateral labia majora and R lower buttock proximal to R thigh. Purulent drainage: absent from lesion with cyst tract. Cyst and tract difficult to remove.  Minimal thick purulent debris expressed from lesion of R buttock.  A small steri strip placed on lateral labial incision. The patient was observed until stable.  Findings: Cyst sac removed for labial lesion.  EBL: minimal cc's   Condition: Tolerated procedure well and Stable   Complications: none.   Assessment/Plan:  Epidermal cyst of vulva   -cyst x 2 -consent obtained for I&D. I&D performed and cyst sac removed x 1. Pt tolerated procedure well.  Older cyst (lateral labial cyst) difficult to remove. -discussed ice, NSAIDs, other supportive care. -given handout -given precautions - Plan: amoxicillin (AMOXIL) 500 MG capsule  F/u  prn  Grier Mitts, MD

## 2019-10-12 ENCOUNTER — Encounter: Payer: Self-pay | Admitting: Family Medicine

## 2019-10-12 ENCOUNTER — Telehealth (INDEPENDENT_AMBULATORY_CARE_PROVIDER_SITE_OTHER): Payer: No Typology Code available for payment source | Admitting: Family Medicine

## 2019-10-12 DIAGNOSIS — R0981 Nasal congestion: Secondary | ICD-10-CM | POA: Diagnosis not present

## 2019-10-12 DIAGNOSIS — J029 Acute pharyngitis, unspecified: Secondary | ICD-10-CM | POA: Diagnosis not present

## 2019-10-12 NOTE — Progress Notes (Signed)
Virtual Visit via Video Note  I connected with Sonia Gibson  on 10/12/19 at  3:40 PM EDT by a video enabled telemedicine application and verified that I am speaking with the correct person using two identifiers. Patient was in the car and the audio was not great so 80% of the appt was completed on video and then converted to phone call.  Location patient: [place] Location provider:work or home office Persons participating in the virtual visit: patient, provider  I discussed the limitations of evaluation and management by telemedicine and the availability of in person appointments. The patient expressed understanding and agreed to proceed.   HPI:  Acute visit for sinus issue "cold": -started last week  -symptoms include nasal congestion, cough, PND, HA, sore throat -denies fevers, malaise, SOB, worst headache, loss of taste or smell, sinus pain, thick or purulent sinus congestion,NVD -denies sick contacts in the last few week, or known possible exposures to COVID19 in the last few   ROS: See pertinent positives and negatives per HPI.  Past Medical History:  Diagnosis Date  . BV (bacterial vaginosis) 05/03/2014  . Medical history non-contributory   . Vaginal discharge 05/03/2014    Past Surgical History:  Procedure Laterality Date  . NO PAST SURGERIES      History reviewed. No pertinent family history.  SOCIAL HX:see hpi   Current Outpatient Medications:  .  valACYclovir (VALTREX) 1000 MG tablet, TAKE 1 TABLET BY MOUTH ONCE DAILY, Disp: 90 tablet, Rfl: 3  EXAM:  VITALS per patient if applicable:  GENERAL: alert, oriented, appears well and in no acute distress  HEENT: atraumatic, conjunttiva clear, no obvious abnormalities on inspection of external nose and ears  NECK: normal movements of the head and neck  LUNGS: on inspection no signs of respiratory distress, breathing rate appears normal, no obvious gross SOB, gasping or wheezing  CV: no obvious cyanosis  MS: moves all  visible extremities without noticeable abnormality  PSYCH/NEURO: pleasant and cooperative, no obvious depression or anxiety, speech and thought processing grossly intact  ASSESSMENT AND PLAN:  Discussed the following assessment and plan:  Sinus congestion  Sore throat  -we discussed possible serious and likely etiologies, options for evaluation and workup, limitations of telemedicine visit vs in person visit, treatment, treatment risks and precautions. Pt prefers to treat via telemedicine empirically rather then risking or undertaking an in person visit at this moment. Query VURI vs other. Disucssed OTC symptomatic care, nasal saline, short course afrin, other options. Discussed possibility of COVID19 and advised testing options/limitations and staying home while sick. Patient agrees to seek prompt in person care if worsening, new symptoms arise, or if is not improving with treatment over the next several days.   I discussed the assessment and treatment plan with the patient. The patient was provided an opportunity to ask questions and all were answered. The patient agreed with the plan and demonstrated an understanding of the instructions.   The patient was advised to call back or seek an in-person evaluation if the symptoms worsen or if the condition fails to improve as anticipated.   Sonia Koyanagi, DO

## 2019-10-26 ENCOUNTER — Ambulatory Visit: Payer: Self-pay | Admitting: Family Medicine

## 2019-10-26 ENCOUNTER — Ambulatory Visit: Payer: No Typology Code available for payment source | Admitting: Family Medicine

## 2019-11-29 ENCOUNTER — Other Ambulatory Visit: Payer: Self-pay

## 2019-11-29 ENCOUNTER — Ambulatory Visit (INDEPENDENT_AMBULATORY_CARE_PROVIDER_SITE_OTHER): Payer: Self-pay | Admitting: Family Medicine

## 2019-11-29 ENCOUNTER — Encounter: Payer: Self-pay | Admitting: Family Medicine

## 2019-11-29 VITALS — BP 100/80 | Temp 98.2°F | Ht 64.0 in | Wt 109.0 lb

## 2019-11-29 DIAGNOSIS — F32A Depression, unspecified: Secondary | ICD-10-CM

## 2019-11-29 DIAGNOSIS — L731 Pseudofolliculitis barbae: Secondary | ICD-10-CM

## 2019-11-29 DIAGNOSIS — F419 Anxiety disorder, unspecified: Secondary | ICD-10-CM

## 2019-11-29 DIAGNOSIS — F329 Major depressive disorder, single episode, unspecified: Secondary | ICD-10-CM

## 2019-11-29 DIAGNOSIS — R300 Dysuria: Secondary | ICD-10-CM

## 2019-11-29 LAB — POCT URINALYSIS DIPSTICK
Bilirubin, UA: NEGATIVE
Glucose, UA: NEGATIVE
Ketones, UA: NEGATIVE
Leukocytes, UA: NEGATIVE
Nitrite, UA: NEGATIVE
Protein, UA: POSITIVE — AB
Spec Grav, UA: >=1.03 — AB (ref 1.010–1.025)
Urobilinogen, UA: 0.2 E.U./dL
pH, UA: 5 (ref 5.0–8.0)

## 2019-11-29 NOTE — Progress Notes (Signed)
Subjective:    Patient ID: Sonia Gibson, female    DOB: 27-Jul-1990, 29 y.o.   MRN: [id number]  Chief Complaint  Patient presents with  . Urinary Tract Infection    HPI Patient was seen today for acute concern.  Patient endorses tingling sensation in groin x3 days.  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 Pt had an ingrown hair on mons pubis a few wks ago, but the bump has remained.  Pt denies any other lesion, fever, chills, n/v, etc.  Pt notes improvement in mood.  Pt tried Paxil but felt like med caused pressure/muscle spasm in butt.  Pt stopped taking med.  Past Medical History:  Diagnosis Date  . BV (bacterial vaginosis) 05/03/2014  . Medical history non-contributory   . Vaginal discharge 05/03/2014    No Known Allergies  ROS General: Denies fever, chills, night sweats, changes in weight, changes in appetite HEENT: Denies headaches, ear pain, changes in vision, rhinorrhea, sore throat CV: Denies CP, palpitations, SOB, orthopnea Pulm: Denies SOB, cough, wheezing GI: Denies abdominal pain, nausea, vomiting, diarrhea, constipation GU: Denies dysuria, hematuria, frequency, vaginal discharge  +tingling sensation with urination Msk: Denies muscle cramps, joint pains Neuro: Denies weakness, numbness, tingling Skin: Denies rashes, bruising  +bump in groin Psych: Denies depression, anxiety, hallucinations    Objective:    Blood pressure 100/80, temperature 98.2 F (36.8 C), temperature source Other (Comment), height 5\' 4"  (1.626 m), weight 109 lb (49.4 kg), last menstrual period 11/27/2019.  Gen. Pleasant, well-nourished, in no distress, normal affect   HEENT: [place]/AT, face symmetric, no scleral icterus, PERRLA, EOMI, nares patent without drainage Lungs: no accessory muscle userales Cardiovascular: RRR, no peripheral edema Musculoskeletal: No deformities, no cyanosis or clubbing, normal tone Neuro:  A&Ox3,  CN II-XII intact, normal gait Skin:  Warm, dry, intact.  Healing hypertrophic area of skin on L lower mons near L labia.  No drainage noted.  No other lesions noted.  Wt Readings from Last 3 Encounters:  11/29/19 109 lb (49.4 kg)  09/25/19 111 lb (50.3 kg)  09/14/19 108 lb (49 kg)    Lab Results  Component Value Date   WBC 5.5 09/14/2019   HGB 14.1 09/14/2019   HCT 42.6 09/14/2019   PLT 198.0 09/14/2019   GLUCOSE 77 09/14/2019   CHOL 149 09/14/2019   TRIG 56.0 09/14/2019   HDL 48.80 09/14/2019   LDLCALC 89 09/14/2019   ALT 7 04/15/2016   AST 14 04/15/2016   NA 140 09/14/2019   K 3.9 09/14/2019   CL 103 09/14/2019   CREATININE 0.70 09/14/2019   BUN 8 09/14/2019   CO2 31 09/14/2019   TSH 1.91 09/14/2019   HGBA1C 5.2 09/14/2019    Assessment/Plan:  Dysuria  -UA without infection -increase po intake of water -given info about HSV - Plan: POCT urinalysis dipstick  Ingrown hair -healing -given handout  Anxiety and depression -PHQ 9 score 10 -GAD 7 score 8 -stable -consider a different medication if symptoms increase. -will d/c Paxil.  F/u prn  09/16/2019, MD

## 2019-11-29 NOTE — Patient Instructions (Signed)
Ingrown Hair  An ingrown hair is a hair that curls and re-enters the skin instead of growing straight out of the skin. An ingrown hair can develop in any part of the skin that hair is removed from. An ingrown hair may cause small pockets of infection. What are the causes? An ingrown hair may be caused by:  Shaving.  Tweezing.  Waxing.  Using a hair removal cream. What increases the risk? You are more likely to develop this condition if you have curly hair. What are the signs or symptoms? Symptoms of an ingrown hair may include:  Small bumps on the skin. The bumps may be filled with pus.  Pain.  Itching. How is this diagnosed? An ingrown hair is diagnosed by a skin exam. How is this treated? Treatment is often not needed unless the ingrown hair has caused an infection. If needed, treatment may include:  Applying prescription creams to the skin. This can help with inflammation.  Applying warm compresses to the skin. This can help soften the skin.  Taking antibiotic medicine. An antibiotic may be prescribed if the infection is severe.  Retracting and releasing the ingrown hair tips. Follow these instructions at home:  Medicines  Take, apply, or use over-the-counter and prescription medicines only as told by your health care provider. This includes any prescription creams.  If you were prescribed an antibiotic medicine, take it as told by your health care provider. Do not stop using the antibiotic even if you start to feel better. General instructions  Do not shave irritated areas of skin. You may start shaving these areas again once the irritation has gone away.  To help remove ingrown hairs on your face, you may use a facial sponge in a gentle circular motion.  Do not pick or squeeze the irritated area of skin as this may cause infection and scarring. Managing pain and swelling  If directed, apply heat to the affected area as often as told by your health care  provider. Use the heat source that your health care provider recommends, such as a moist heat pack or a heating pad. ? Place a towel between your skin and the heat source. ? Leave the heat on for 20-30 minutes. ? Remove the heat if your skin turns bright red. This is especially important if you are unable to feel pain, heat, or cold. You may have a greater risk of getting burned. How is this prevented?  Shower before shaving.  Wrap areas that you are going to shave in warm, moist wraps for several minutes before shaving. The warmth and moisture help to soften the hairs and makes ingrown hairs less likely.  Use thick shaving gels.  Use a razor that cuts hair slightly above your skin, or use an electric shaver with a long shave setting.  Shave in the direction of hair growth.  Avoid making multiple razor strokes.  Apply a moisturizing lotion after shaving. Summary  An ingrown hair is a hair that curls and re-enters the skin instead of growing straight out of the skin.  Treatment is often not needed unless the ingrown hair has caused an infection.  Take, apply, or use over-the-counter and prescription medicines only as told by your health care provider. This includes any prescription creams.  Do not shave irritated areas of skin. You may start shaving these areas again once the irritation has gone away.  If directed, apply heat to the affected area. Use the heat source that your health care provider   recommends, such as a moist heat pack or a heating pad. This information is not intended to replace advice given to you by your health care provider. Make sure you discuss any questions you have with your health care provider. Document Revised: 11/10/2017 Document Reviewed: 11/10/2017 Elsevier Patient Education  2020 [employer]  Genital Herpes Genital herpes is a common sexually transmitted infection (STI) that is caused by a virus. The virus spreads from person to person through  sexual contact. Infection can cause itching, blisters, and sores around the genitals or rectum. Symptoms may last several days and then go away This is called an outbreak. However, the virus remains in your body, so you may have more outbreaks in the future. The time between outbreaks varies and can be months or years. Genital herpes affects men and women. It is particularly concerning for pregnant women because the virus can be passed to the baby during delivery and can cause serious problems. Genital herpes is also a concern for people who have a weak disease-fighting (immune) system. What are the causes? This condition is caused by the herpes simplex virus (HSV) type 1 or type 2. The virus may spread through:  Sexual contact with an infected person, including vaginal, anal, and oral sex.  Contact with fluid from a herpes sore.  The skin. This means that you can get herpes from an infected partner even if he or she does not have a visible sore or does not know that he or she is infected. What increases the risk? You are more likely to develop this condition if:  You have sex with many partners.  You do not use latex condoms during sex. What are the signs or symptoms? Most people do not have symptoms (asymptomatic) or have mild symptoms that may be mistaken for other skin problems. Symptoms may include:  Small red bumps near the genitals, rectum, or mouth. These bumps turn into blisters and then turn into sores.  Flu-like symptoms, including: ? Fever. ? Body aches. ? Swollen lymph nodes. ? Headache.  Painful urination.  Pain and itching in the genital area or rectal area.  Vaginal discharge.  Tingling or shooting pain in the legs and buttocks. Generally, symptoms are more severe and last longer during the first (primary) outbreak. Flu-like symptoms are also more common during the primary outbreak. How is this diagnosed? Genital herpes may be diagnosed based on:  A physical  exam.  Your medical history.  Blood tests.  A test of a fluid sample (culture) from an open sore. How is this treated? There is no cure for this condition, but treatment with antiviral medicines that are taken by mouth (orally) can do the following:  Speed up healing and relieve symptoms.  Help to reduce the spread of the virus to sexual partners.  Limit the chance of future outbreaks, or make future outbreaks shorter.  Lessen symptoms of future outbreaks. Your health care provider may also recommend pain relief medicines, such as aspirin or ibuprofen. Follow these instructions at home: Sexual activity  Do not have sexual contact during active outbreaks.  Practice safe sex. Latex condoms and female condoms may help prevent the spread of the herpes virus. General instructions  Keep the affected areas dry and clean.  Take over-the-counter and prescription medicines only as told by your health care provider.  Avoid rubbing or touching blisters and sores. If you do touch blisters or sores: ? Wash your hands thoroughly with soap and water. ? Do not touch  your eyes afterward.  To help relieve pain or itching, you may take the following actions as directed by your health care provider: ? Apply a cold, wet cloth (cold compress) to affected areas 4-6 times a day. ? Apply a substance that protects your skin and reduces bleeding (astringent). ? Apply a gel that helps relieve pain around sores (lidocaine gel). ? Take a warm, shallow bath that cleans the genital area (sitz bath).  Keep all follow-up visits as told by your health care provider. This is important. How is this prevented?  Use condoms. Although anyone can get genital herpes during sexual contact, even with the use of a condom, a condom can provide some protection.  Avoid having multiple sexual partners.  Talk with your sexual partner about any symptoms either of you may have. Also, talk with your partner about any  history of STIs.  Get tested for STIs before you have sex. Ask your partner to do the same.  Do not have sexual contact if you have symptoms of genital herpes. Contact a health care provider if:  Your symptoms are not improving with medicine.  Your symptoms return.  You have new symptoms.  You have a fever.  You have abdominal pain.  You have redness, swelling, or pain in your eye.  You notice new sores on other parts of your body.  You are a woman and experience bleeding between menstrual periods.  You have had herpes and you become pregnant or plan to become pregnant. Summary  Genital herpes is a common sexually transmitted infection (STI) that is caused by the herpes simplex virus (HSV) type 1 or type 2.  These viruses are most often spread through sexual contact with an infected person.  You are more likely to develop this condition if you have sex with many partners or you have unprotected sex.  Most people do not have symptoms (asymptomatic) or have mild symptoms that may be mistaken for other skin problems. Symptoms occur as outbreaks that may happen months or years apart.  There is no cure for this condition, but treatment with oral antiviral medicines can reduce symptoms, reduce the chance of spreading the virus to a partner, prevent future outbreaks, or shorten future outbreaks. This information is not intended to replace advice given to you by your health care provider. Make sure you discuss any questions you have with your health care provider. Document Revised: 11/08/2017 Document Reviewed: 04/03/2016 Elsevier Patient Education  2020 [employer].

## 2019-12-01 MED FILL — valACYclovir HCL 1 GM TABS: 1 | 30 days supply | Qty: 30 | Fill #2

## 2019-12-28 ENCOUNTER — Encounter: Payer: Self-pay | Admitting: Family Medicine

## 2019-12-29 ENCOUNTER — Telehealth (INDEPENDENT_AMBULATORY_CARE_PROVIDER_SITE_OTHER): Payer: Self-pay | Admitting: Family Medicine

## 2019-12-29 ENCOUNTER — Encounter: Payer: Self-pay | Admitting: Family Medicine

## 2019-12-29 DIAGNOSIS — R0982 Postnasal drip: Secondary | ICD-10-CM

## 2019-12-29 DIAGNOSIS — J302 Other seasonal allergic rhinitis: Secondary | ICD-10-CM

## 2019-12-29 MED ORDER — CETIRIZINE HCL 10 MG PO TABS: 10.0000 mg | ORAL_TABLET | Freq: Every day | ORAL | 11 refills | Status: AC

## 2019-12-29 NOTE — Telephone Encounter (Signed)
Reviewed urine results with pt verbalized understanding

## 2019-12-29 NOTE — Progress Notes (Signed)
Virtual Visit via Video Note  I connected with Sonia Gibson on 12/29/19 at 10:30 AM EDT by a video enabled telemedicine application 2/2 COVID-19 pandemic and verified that I am speaking with the correct person using two identifiers.  Location patient: home Location provider:work or home office Persons participating in the virtual visit: patient, provider  I discussed the limitations of evaluation and management by telemedicine and the availability of in person appointments. The patient expressed understanding and agreed to proceed.   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  Pt notes dry throat x 1 yr.  Irritated sensation at times.  Worse at night.  Endorses post nasal drainage and dry eyes at night.  Pt denies coughing, ear pain, ear pressure, sinus pain, sinus pressure, rhinorrhea, nasal congestion.  Pt tried Mucinex or some OTC med.   ROS: See pertinent positives and negatives per HPI.  Past Medical History:  Diagnosis Date  . BV (bacterial vaginosis) 05/03/2014  . Medical history non-contributory   . Vaginal discharge 05/03/2014    Past Surgical History:  Procedure Laterality Date  . NO PAST SURGERIES      History reviewed. No pertinent family history.  Current Outpatient Medications:  .  valACYclovir (VALTREX) 1000 MG tablet, TAKE 1 TABLET BY MOUTH ONCE DAILY, Disp: 90 tablet, Rfl: 3  EXAM:  VITALS per patient if applicable: RR 12-20 bpm  GENERAL: alert, oriented, appears well and in no acute distress  HEENT: atraumatic, conjunctiva clear, no obvious abnormalities on inspection of external nose and ears  NECK: normal movements of the head and neck  LUNGS: on inspection no signs of respiratory distress, breathing rate appears normal, no obvious gross SOB, gasping or wheezing  CV: no obvious cyanosis  MS: moves all visible extremities without noticeable abnormality  PSYCH/NEURO: pleasant and cooperative, no  obvious depression or anxiety, speech and thought processing grossly intact  ASSESSMENT AND PLAN:  Discussed the following assessment and plan:  Post-nasal drainage  -Discussed likely 2/2 seasonal allergies or other allergic irritant, or silent reflux 2/2 GERD -Discussed trying OTC allergy medication as needed such as Claritin, Zyrtec, Allegra, Xyzal -Also consider saline nasal rinse or Flonase nasal spray for continued symptoms -We will try Zyrtec.  For continued symptoms consider trial of PPI - Plan: cetirizine (ZYRTEC) 10 MG tablet  Seasonal allergies  Follow-up as needed   I discussed the assessment and treatment plan with the patient. The patient was provided an opportunity to ask questions and all were answered. The patient agreed with the plan and demonstrated an understanding of the instructions.   The patient was advised to call back or seek an in-person evaluation if the symptoms worsen or if the condition fails to improve as anticipated.  I provided 11 minutes of non-face-to-face time during this encounter.   Deeann Saint, MD

## 2020-01-10 ENCOUNTER — Encounter: Payer: Self-pay | Admitting: Family Medicine

## 2020-02-16 ENCOUNTER — Other Ambulatory Visit: Payer: Self-pay | Admitting: Women's Health

## 2020-02-16 MED FILL — valACYclovir HCL 1 GM TABS: 1 | 30 days supply | Qty: 30 | Fill #2

## 2020-02-29 ENCOUNTER — Encounter: Payer: Self-pay | Admitting: Family Medicine

## 2020-02-29 ENCOUNTER — Other Ambulatory Visit: Payer: Self-pay

## 2020-02-29 ENCOUNTER — Telehealth (INDEPENDENT_AMBULATORY_CARE_PROVIDER_SITE_OTHER): Payer: 59 | Admitting: Family Medicine

## 2020-02-29 DIAGNOSIS — R519 Headache, unspecified: Secondary | ICD-10-CM | POA: Diagnosis not present

## 2020-02-29 DIAGNOSIS — G4700 Insomnia, unspecified: Secondary | ICD-10-CM

## 2020-02-29 NOTE — Progress Notes (Signed)
Virtual Visit via Telephone Note  I connected with Sonia Gibson on 02/29/20 at  3:00 PM EDT by telephone and verified that I am speaking with the correct person using two identifiers.   I discussed the limitations, risks, security and privacy concerns of performing an evaluation and management service by telephone and the availability of in person appointments. I also discussed with the patient that there may be a patient responsible charge related to this service. The patient expressed understanding and agreed to proceed.  Location patient: home Location provider: work or home office Participants present for the call: patient, provider Patient did not have a visit in the prior 7 days to address this/these issue(s).   History of Present Illness: Pt is a 29 year old female with past medical history significant for seropositive HSV and eczema.  Pt endorses HA that started on Sunday and lasted until Wednesday.  The HA was achy one day.  The next day her whole head hurt.  Pt denies increased caffeine intake.  Tried ibuprofen.  Pt did not have an appetite when her head was hurting.  Pt drinking 1 bottle of water per day.  States sleep is "up and down".  May get in bed around 11 PM and up by 6:45 AM.  Pt wears contacts.  Pt states stress level is about the same.   Observations/Objective: Patient sounds cheerful and well on the phone. I do not appreciate any SOB. Speech and thought processing are grossly intact. Patient reported vitals:  Assessment and Plan: Chronic daily headache -Discussed possible causes of headaches including stress, dehydration, lack of sleep, MSG intake. -Discussed supportive care including NSAIDs, increasing p.o. intake of water, sleep hygiene, decreasing caffeine intake -Patient to contact clinic next week if headaches continue.  At that time consider ordering imaging of brain. -Consider Zofran if develops nausea. -Consider headache diary  Insomnia -Discussed sleep  hygiene -Consider melatonin versus prescription medication for sleep -We will continue to monitor  Follow Up Instructions: F/u prn  I did not refer this patient for an OV in the next 24 hours for this/these issue(s).  I discussed the assessment and treatment plan with the patient. The patient was provided an opportunity to ask questions and all were answered. The patient agreed with the plan and demonstrated an understanding of the instructions.   The patient was advised to call back or seek an in-person evaluation if the symptoms worsen or if the condition fails to improve as anticipated.  I provided 7 minutes of non-face-to-face time during this encounter.   Deeann Saint, MD

## 2020-03-26 MED FILL — valACYclovir HCL 1 GM TABS: 1 | 30 days supply | Qty: 30 | Fill #3

## 2020-05-08 ENCOUNTER — Other Ambulatory Visit: Payer: Self-pay | Admitting: Family Medicine

## 2020-05-15 ENCOUNTER — Other Ambulatory Visit: Payer: Self-pay | Admitting: Family Medicine

## 2020-05-15 ENCOUNTER — Other Ambulatory Visit: Payer: Self-pay

## 2020-05-15 ENCOUNTER — Encounter: Payer: Self-pay | Admitting: Family Medicine

## 2020-05-15 MED FILL — valACYclovir HCL 1 GM TABS: 1 | 30 days supply | Qty: 30 | Fill #0

## 2020-06-19 MED FILL — valACYclovir HCL 1 GM TABS: 1 | 30 days supply | Qty: 30 | Fill #1

## 2020-07-20 ENCOUNTER — Encounter: Payer: Self-pay | Admitting: Family Medicine

## 2020-07-22 MED FILL — valACYclovir HCL 1 GM TABS: 1 | 30 days supply | Qty: 30 | Fill #2

## 2020-08-07 ENCOUNTER — Telehealth: Payer: 59 | Admitting: Physician Assistant

## 2020-08-07 ENCOUNTER — Other Ambulatory Visit (HOSPITAL_COMMUNITY): Payer: Self-pay | Admitting: Physician Assistant

## 2020-08-07 ENCOUNTER — Encounter: Payer: Self-pay | Admitting: Physician Assistant

## 2020-08-07 DIAGNOSIS — N760 Acute vaginitis: Secondary | ICD-10-CM

## 2020-08-07 DIAGNOSIS — B9689 Other specified bacterial agents as the cause of diseases classified elsewhere: Secondary | ICD-10-CM

## 2020-08-07 MED ORDER — METRONIDAZOLE 0.75 % EX GEL: CUTANEOUS | 0 refills | Status: DC

## 2020-08-07 MED FILL — metroNIDAZOLE 0.75 % GEL: 0.75 | 5 days supply | Qty: 45 | Fill #0

## 2020-08-07 NOTE — Patient Instructions (Signed)
Instructions sent to patients MyChart.

## 2020-08-07 NOTE — Progress Notes (Signed)
Sonia Gibson, Sonia Gibson are scheduled for a virtual visit with your provider today.    Just as we do with appointments in the office, we must obtain your consent to participate.  Your consent will be active for this visit and any virtual visit you may have with one of our providers in the next 365 days.    If you have a MyChart account, I can also send a copy of this consent to you electronically.  All virtual visits are billed to your insurance company just like a traditional visit in the office.  As this is a virtual visit, video technology does not allow for your provider to perform a traditional examination.  This may limit your provider's ability to fully assess your condition.  If your provider identifies any concerns that need to be evaluated in person or the need to arrange testing such as labs, EKG, etc, we will make arrangements to do so.    Although advances in technology are sophisticated, we cannot ensure that it will always work on either your end or our end.  If the connection with a video visit is poor, we may have to switch to a telephone visit.  With either a video or telephone visit, we are not always able to ensure that we have a secure connection.   I need to obtain your verbal consent now.   Are you willing to proceed with your visit today?   [name] has provided verbal consent on 08/07/2020 for a virtual visit (video or telephone).  Piedad Climes, PA-C 08/07/2020  3:32 PM    Virtual Visit via Video   I connected with patient on 08/07/20 at  3:45 PM EDT by a video enabled telemedicine application and verified that I am speaking with the correct person using two identifiers.  Location patient: Home Location provider: [facility] Persons participating in the virtual visit: Patient, Provider  I discussed the limitations of evaluation and management by telemedicine and the availability of in person appointments. The patient expressed understanding and  agreed to proceed.  Subjective:   HPI:   Patient presents via [facility] today complaining of significant foul-smelling vaginal odor over the past couple days after starting her menstrual..  Notes a recent change in soap, just before onset of smell.  Has a history of BV and says this episode is identical to previous episodes.  Does not note any discharge at present but states it is very hard considering she is on day 2 of her period.  Denies any lower abdominal pelvic pain or discomfort.  Denies urinary symptoms.  Denies any fever, chills, malaise or fatigue.  Is not sexually active.  Is taking iron pills OTC while on her menstrual period.    ROS:   See pertinent positives and negatives per HPI.  Patient Active Problem List   Diagnosis Date Noted  . Eczema 06/10/2018  . BV (bacterial vaginosis) 05/03/2014  . HSV-2 seropositive 01/03/2014  . Trichomonas infection 12/13/2013    Social History   Tobacco Use  . Smoking status: Never Smoker  . Smokeless tobacco: Never Used  Substance Use Topics  . Alcohol use: No    Current Outpatient Medications:  .  cetirizine (ZYRTEC) 10 MG tablet, Take 1 tablet (10 mg total) by mouth daily., Disp: 30 tablet, Rfl: 11 .  valACYclovir (VALTREX) 1000 MG tablet, TAKE 1 TABLET BY MOUTH ONCE DAILY, Disp: 30 tablet, Rfl: 3  No Known Allergies  Objective:   There were  no vitals taken for this visit.  Patient is well-developed, well-nourished in no acute distress.  Resting comfortably at home.  Head is normocephalic, atraumatic.  No labored breathing.  Speech is clear and coherent with logical content.  Patient is alert and oriented at baseline.   Assessment and Plan:   1. Bacterial vaginosis Likely culprit given pH changes due to change in hygiene products and menstrual period.  Will start Rx MetroGel per patient preference as she does not want an oral medication.  Discussed use of probiotic.  Follow-up with primary care provider symptoms not  improving. - metroNIDAZOLE (METROGEL) 0.75 % gel; Insert one applicatorful (5g) of medicine into the vagina once nightly x 5 days  Dispense: 25 g; Refill: 0    Piedad Climes, PA-C 08/07/2020

## 2020-08-21 ENCOUNTER — Other Ambulatory Visit (HOSPITAL_COMMUNITY): Payer: Self-pay

## 2020-08-21 MED FILL — Valacyclovir HCl Tab 1 GM: ORAL | 30 days supply | Qty: 30 | Fill #0 | Status: AC

## 2020-08-23 ENCOUNTER — Ambulatory Visit: Payer: 59 | Admitting: Family Medicine

## 2020-09-16 ENCOUNTER — Encounter: Payer: 59 | Admitting: Family Medicine

## 2020-10-01 ENCOUNTER — Other Ambulatory Visit: Payer: Self-pay

## 2020-10-02 ENCOUNTER — Ambulatory Visit (INDEPENDENT_AMBULATORY_CARE_PROVIDER_SITE_OTHER): Payer: No Typology Code available for payment source | Admitting: Family Medicine

## 2020-10-02 ENCOUNTER — Other Ambulatory Visit (HOSPITAL_COMMUNITY): Payer: Self-pay

## 2020-10-02 ENCOUNTER — Encounter: Payer: Self-pay | Admitting: Family Medicine

## 2020-10-02 ENCOUNTER — Other Ambulatory Visit (HOSPITAL_COMMUNITY)
Admission: RE | Admit: 2020-10-02 | Discharge: 2020-10-02 | Disposition: A | Discharge status: Home / Self Care | Payer: No Typology Code available for payment source | Source: Ambulatory Visit | Attending: Family Medicine | Admitting: Family Medicine

## 2020-10-02 VITALS — BP 90/62 | HR 67 | Temp 98.2°F | Ht 64.25 in | Wt 105.6 lb

## 2020-10-02 DIAGNOSIS — Z0000 Encounter for general adult medical examination without abnormal findings: Secondary | ICD-10-CM | POA: Diagnosis not present

## 2020-10-02 DIAGNOSIS — Z13220 Encounter for screening for lipoid disorders: Secondary | ICD-10-CM | POA: Diagnosis not present

## 2020-10-02 DIAGNOSIS — F320 Major depressive disorder, single episode, mild: Secondary | ICD-10-CM

## 2020-10-02 DIAGNOSIS — Z113 Encounter for screening for infections with a predominantly sexual mode of transmission: Secondary | ICD-10-CM | POA: Diagnosis not present

## 2020-10-02 DIAGNOSIS — F419 Anxiety disorder, unspecified: Secondary | ICD-10-CM

## 2020-10-02 DIAGNOSIS — Z124 Encounter for screening for malignant neoplasm of cervix: Secondary | ICD-10-CM

## 2020-10-02 DIAGNOSIS — R768 Other specified abnormal immunological findings in serum: Secondary | ICD-10-CM

## 2020-10-02 DIAGNOSIS — R634 Abnormal weight loss: Secondary | ICD-10-CM

## 2020-10-02 LAB — CBC WITH DIFFERENTIAL/PLATELET
Basophils Absolute: 0 K/uL (ref 0.0–0.1)
Basophils Relative: 0.7 % (ref 0.0–3.0)
Eosinophils Absolute: 0.1 K/uL (ref 0.0–0.7)
Eosinophils Relative: 1.6 % (ref 0.0–5.0)
HCT: 40.2 % (ref 36.0–46.0)
Hemoglobin: 13.7 g/dL (ref 12.0–15.0)
Lymphocytes Relative: 40.2 % (ref 12.0–46.0)
Lymphs Abs: 1.6 K/uL (ref 0.7–4.0)
MCHC: 34 g/dL (ref 30.0–36.0)
MCV: 94.9 fl (ref 78.0–100.0)
Monocytes Absolute: 0.2 K/uL (ref 0.1–1.0)
Monocytes Relative: 6.1 % (ref 3.0–12.0)
Neutro Abs: 2.1 K/uL (ref 1.4–7.7)
Neutrophils Relative %: 51.4 % (ref 43.0–77.0)
Platelets: 211 K/uL (ref 150.0–400.0)
RBC: 4.23 Mil/uL (ref 3.87–5.11)
RDW: 13.6 % (ref 11.5–15.5)
WBC: 4.1 K/uL (ref 4.0–10.5)

## 2020-10-02 LAB — LIPID PANEL
Cholesterol: 145 mg/dL (ref 0–200)
HDL: 47.7 mg/dL (ref >39.00)
LDL Cholesterol: 86 mg/dL (ref 0–99)
NonHDL: 97.02
Total CHOL/HDL Ratio: 3
Triglycerides: 57 mg/dL (ref 0.0–149.0)
VLDL: 11.4 mg/dL (ref 0.0–40.0)

## 2020-10-02 LAB — TSH: TSH: 1.81 u[IU]/mL (ref 0.35–4.50)

## 2020-10-02 LAB — BASIC METABOLIC PANEL WITH GFR
BUN: 7 mg/dL (ref 6–23)
CO2: 28 meq/L (ref 19–32)
Calcium: 9 mg/dL (ref 8.4–10.5)
Chloride: 105 meq/L (ref 96–112)
Creatinine, Ser: 0.69 mg/dL (ref 0.40–1.20)
GFR: 115.9 mL/min
Glucose, Bld: 76 mg/dL (ref 70–99)
Potassium: 3.8 meq/L (ref 3.5–5.1)
Sodium: 141 meq/L (ref 135–145)

## 2020-10-02 LAB — T4, FREE: Free T4: 0.79 ng/dL (ref 0.60–1.60)

## 2020-10-02 LAB — HEMOGLOBIN A1C: Hgb A1c MFr Bld: 5.3 % (ref 4.6–6.5)

## 2020-10-02 MED ORDER — VALACYCLOVIR HCL 1 G PO TABS
1000.0000 mg | ORAL_TABLET | Freq: Every day | ORAL | 3 refills | Status: DC
  Filled 2020-10-02: qty 30, 30d supply, fill #0
  Filled 2020-11-21: qty 30, 30d supply, fill #1
  Filled 2021-01-30: qty 30, 30d supply, fill #2

## 2020-10-02 MED ORDER — SERTRALINE HCL 25 MG PO TABS
25.0000 mg | ORAL_TABLET | Freq: Every day | ORAL | 3 refills | Status: DC
  Filled 2020-10-02: qty 30, 30d supply, fill #0

## 2020-10-02 NOTE — Patient Instructions (Addendum)
Preventive Care 21-31 Years Old, Female Preventive care refers to lifestyle choices and visits with your health care provider that can promote health and wellness. This includes:  A yearly physical exam. This is also called an annual wellness visit.  Regular dental and eye exams.  Immunizations.  Screening for certain conditions.  Healthy lifestyle choices, such as: ? Eating a healthy diet. ? Getting regular exercise. ? Not using drugs or products that contain nicotine and tobacco. ? Limiting alcohol use. What can I expect for my preventive care visit? Physical exam Your health care provider may check your:  Height and weight. These may be used to calculate your BMI (body mass index). BMI is a measurement that tells if you are at a healthy weight.  Heart rate and blood pressure.  Body temperature.  Skin for abnormal spots. Counseling Your health care provider may ask you questions about your:  Past medical problems.  Family's medical history.  Alcohol, tobacco, and drug use.  Emotional well-being.  Home life and relationship well-being.  Sexual activity.  Diet, exercise, and sleep habits.  Work and work environment.  Access to firearms.  Method of birth control.  Menstrual cycle.  Pregnancy history. What immunizations do I need? Vaccines are usually given at various ages, according to a schedule. Your health care provider will recommend vaccines for you based on your age, medical history, and lifestyle or other factors, such as travel or where you work.   What tests do I need? Blood tests  Lipid and cholesterol levels. These may be checked every 5 years starting at age 20.  Hepatitis C test.  Hepatitis B test. Screening  Diabetes screening. This is done by checking your blood sugar (glucose) after you have not eaten for a while (fasting).  STD (sexually transmitted disease) testing, if you are at risk.  BRCA-related cancer screening. This may be  done if you have a family history of breast, ovarian, tubal, or peritoneal cancers.  Pelvic exam and Pap test. This may be done every 3 years starting at age 21. Starting at age 30, this may be done every 5 years if you have a Pap test in combination with an HPV test. Talk with your health care provider about your test results, treatment options, and if necessary, the need for more tests.   Follow these instructions at home: Eating and drinking  Eat a healthy diet that includes fresh fruits and vegetables, whole grains, lean protein, and low-fat dairy products.  Take vitamin and mineral supplements as recommended by your health care provider.  Do not drink alcohol if: ? Your health care provider tells you not to drink. ? You are pregnant, may be pregnant, or are planning to become pregnant.  If you drink alcohol: ? Limit how much you have to 0-1 drink a day. ? Be aware of how much alcohol is in your drink. In the [place] one drink equals one 12 oz bottle of beer (355 mL), one 5 oz glass of wine (148 mL), or one 1 oz glass of hard liquor (44 mL).   Lifestyle  Take daily care of your teeth and gums. Brush your teeth every morning and night with fluoride toothpaste. Floss one time each day.  Stay active. Exercise for at least 30 minutes 5 or more days each week.  Do not use any products that contain nicotine or tobacco, such as cigarettes, e-cigarettes, and chewing tobacco. If you need help quitting, ask your health care provider.  Do not   use drugs.  If you are sexually active, practice safe sex. Use a condom or other form of protection to prevent STIs (sexually transmitted infections).  If you do not wish to become pregnant, use a form of birth control. If you plan to become pregnant, see your health care provider for a prepregnancy visit.  Find healthy ways to cope with stress, such as: ? Meditation, yoga, or listening to music. ? Journaling. ? Talking to a trusted  person. ? Spending time with friends and family. Safety  Always wear your seat belt while driving or riding in a vehicle.  Do not drive: ? If you have been drinking alcohol. Do not ride with someone who has been drinking. ? When you are tired or distracted. ? While texting.  Wear a helmet and other protective equipment during sports activities.  If you have firearms in your house, make sure you follow all gun safety procedures.  Seek help if you have been physically or sexually abused. What's next?  Go to your health care provider once a year for an annual wellness visit.  Ask your health care provider how often you should have your eyes and teeth checked.  Stay up to date on all vaccines. This information is not intended to replace advice given to you by your health care provider. Make sure you discuss any questions you have with your health care provider. Document Revised: 12/31/2019 Document Reviewed: 01/13/2018 Elsevier Patient Education  2021 [place], Adult After being diagnosed with an anxiety disorder, you may be relieved to know why you have felt or behaved a certain way. You may also feel overwhelmed about the treatment ahead and what it will mean for your life. With care and support, you can manage this condition and recover from it. How to manage lifestyle changes Managing stress and anxiety Stress is your body's reaction to life changes and events, both good and bad. Most stress will last just a few hours, but stress can be ongoing and can lead to more than just stress. Although stress can play a major role in anxiety, it is not the same as anxiety. Stress is usually caused by something external, such as a deadline, test, or competition. Stress normally passes after the triggering event has ended.  Anxiety is caused by something internal, such as imagining a terrible outcome or worrying that something will go wrong that will devastate you. Anxiety  often does not go away even after the triggering event is over, and it can become long-term (chronic) worry. It is important to understand the differences between stress and anxiety and to manage your stress effectively so that it does not lead to an anxious response. Talk with your health care provider or a counselor to learn more about reducing anxiety and stress. He or she may suggest tension reduction techniques, such as:  Music therapy. This can include creating or listening to music that you enjoy and that inspires you.  Mindfulness-based meditation. This involves being aware of your normal breaths while not trying to control your breathing. It can be done while sitting or walking.  Centering prayer. This involves focusing on a word, phrase, or sacred image that means something to you and brings you peace.  Deep breathing. To do this, expand your stomach and inhale slowly through your nose. Hold your breath for 3-5 seconds. Then exhale slowly, letting your stomach muscles relax.  Self-talk. This involves identifying thought patterns that lead to anxiety reactions and changing  those patterns.  Muscle relaxation. This involves tensing muscles and then relaxing them. Choose a tension reduction technique that suits your lifestyle and personality. These techniques take time and practice. Set aside 5-15 minutes a day to do them. Therapists can offer counseling and training in these techniques. The training to help with anxiety may be covered by some insurance plans. Other things you can do to manage stress and anxiety include:  Keeping a stress/anxiety diary. This can help you learn what triggers your reaction and then learn ways to manage your response.  Thinking about how you react to certain situations. You may not be able to control everything, but you can control your response.  Making time for activities that help you relax and not feeling guilty about spending your time in this  way.  Visual imagery and yoga can help you stay calm and relax.   Medicines Medicines can help ease symptoms. Medicines for anxiety include:  Anti-anxiety drugs.  Antidepressants. Medicines are often used as a primary treatment for anxiety disorder. Medicines will be prescribed by a health care provider. When used together, medicines, psychotherapy, and tension reduction techniques may be the most effective treatment. Relationships Relationships can play a big part in helping you recover. Try to spend more time connecting with trusted friends and family members. Consider going to couples counseling, taking family education classes, or going to family therapy. Therapy can help you and others better understand your condition. How to recognize changes in your anxiety Everyone responds differently to treatment for anxiety. Recovery from anxiety happens when symptoms decrease and stop interfering with your daily activities at home or work. This may mean that you will start to:  Have better concentration and focus. Worry will interfere less in your daily thinking.  Sleep better.  Be less irritable.  Have more energy.  Have improved memory. It is important to recognize when your condition is getting worse. Contact your health care provider if your symptoms interfere with home or work and you feel like your condition is not improving. Follow these instructions at home: Activity  Exercise. Most adults should do the following: ? Exercise for at least 150 minutes each week. The exercise should increase your heart rate and make you sweat (moderate-intensity exercise). ? Strengthening exercises at least twice a week.  Get the right amount and quality of sleep. Most adults need 7-9 hours of sleep each night. Lifestyle  Eat a healthy diet that includes plenty of vegetables, fruits, whole grains, low-fat dairy products, and lean protein. Do not eat a lot of foods that are high in solid fats, added  sugars, or salt.  Make choices that simplify your life.  Do not use any products that contain nicotine or tobacco, such as cigarettes, e-cigarettes, and chewing tobacco. If you need help quitting, ask your health care provider.  Avoid caffeine, alcohol, and certain over-the-counter cold medicines. These may make you feel worse. Ask your pharmacist which medicines to avoid.   General instructions  Take over-the-counter and prescription medicines only as told by your health care provider.  Keep all follow-up visits as told by your health care provider. This is important. Where to find support You can get help and support from these sources:  Self-help groups.  Online and [employer].  A trusted spiritual leader.  Couples counseling.  Family education classes.  Family therapy. Where to find more information You may find that joining a support group helps you deal with your anxiety. The following sources can  help you locate counselors or support groups near you:  [place]: www.mentalhealthamerica.net  Anxiety and Depression Association of [place] (ADAA): https://www.clark.net/  National Alliance on Mental Illness (NAMI): www.nami.org Contact a health care provider if you:  Have a hard time staying focused or finishing daily tasks.  Spend many hours a day feeling worried about everyday life.  Become exhausted by worry.  Start to have headaches, feel tense, or have nausea.  Urinate more than normal.  Have diarrhea. Get help right away if you have:  A racing heart and shortness of breath.  Thoughts of hurting yourself or others. If you ever feel like you may hurt yourself or others, or have thoughts about taking your own life, get help right away. You can go to your nearest emergency department or call:  Your local emergency services (911 in the [place]  A suicide crisis helpline, such as the [place] at 431-318-5936. This  is open 24 hours a day. Summary  Taking steps to learn and use tension reduction techniques can help calm you and help prevent triggering an anxiety reaction.  When used together, medicines, psychotherapy, and tension reduction techniques may be the most effective treatment.  Family, friends, and partners can play a big part in helping you recover from an anxiety disorder. This information is not intended to replace advice given to you by your health care provider. Make sure you discuss any questions you have with your health care provider. Document Revised: 10/04/2018 Document Reviewed: 10/04/2018 Elsevier Patient Education  [place].  http://APA.org/depression-guideline"> https://clinicalkey.com"> http://point-of-care.elsevierperformancemanager.com/skills/"> http://point-of-care.elsevierperformancemanager.com">  Managing Depression, Adult Depression is a mental health condition that affects your thoughts, feelings, and actions. Being diagnosed with depression can bring you relief if you did not know why you have felt or behaved a certain way. It could also leave you feeling overwhelmed with uncertainty about your future. Preparing yourself to manage your symptoms can help you feel more positive about your future. How to manage lifestyle changes Managing stress Stress is your body's reaction to life changes and events, both good and bad. Stress can add to your feelings of depression. Learning to manage your stress can help lessen your feelings of depression. Try some of the following approaches to reducing your stress (stress reduction techniques):  Listen to music that you enjoy and that inspires you.  Try using a meditation app or take a meditation class.  Develop a practice that helps you connect with your spiritual self. Walk in nature, pray, or go to a place of worship.  Do some deep breathing. To do this, inhale slowly through your nose. Pause at the top of your inhale for  a few seconds and then exhale slowly, letting your muscles relax.  Practice yoga to help relax and work your muscles. Choose a stress reduction technique that suits your lifestyle and personality. These techniques take time and practice to develop. Set aside 5-15 minutes a day to do them. Therapists can offer training in these techniques. Other things you can do to manage stress include:  Keeping a stress diary.  Knowing your limits and saying no when you think something is too much.  Paying attention to how you react to certain situations. You may not be able to control everything, but you can change your reaction.  Adding humor to your life by watching funny films or TV shows.  Making time for activities that you enjoy and that relax you.   Medicines Medicines, such as antidepressants, are  often a part of treatment for depression.  Talk with your pharmacist or health care provider about all the medicines, supplements, and herbal products that you take, their possible side effects, and what medicines and other products are safe to take together.  Make sure to report any side effects you may have to your health care provider. Relationships Your health care provider may suggest family therapy, couples therapy, or individual therapy as part of your treatment. How to recognize changes Everyone responds differently to treatment for depression. As you recover from depression, you may start to:  Have more interest in doing activities.  Feel less hopeless.  Have more energy.  Overeat less often, or have a better appetite.  Have better mental focus. It is important to recognize if your depression is not getting better or is getting worse. The symptoms you had in the beginning may return, such as:  Tiredness (fatigue) or low energy.  Eating too much or too little.  Sleeping too much or too little.  Feeling restless, agitated, or hopeless.  Trouble focusing or making  decisions.  Unexplained physical complaints.  Feeling irritable, angry, or aggressive. If you or your family members notice these symptoms coming back, let your health care provider know right away. Follow these instructions at home: Activity  Try to get some form of exercise each day, such as walking, biking, swimming, or lifting weights.  Practice stress reduction techniques.  Engage your mind by taking a class or doing some volunteer work.   Lifestyle  Get the right amount and quality of sleep.  Cut down on using caffeine, tobacco, alcohol, and other potentially harmful substances.  Eat a healthy diet that includes plenty of vegetables, fruits, whole grains, low-fat dairy products, and lean protein. Do not eat a lot of foods that are high in solid fats, added sugars, or salt (sodium). General instructions  Take over-the-counter and prescription medicines only as told by your health care provider.  Keep all follow-up visits as told by your health care provider. This is important. Where to find support Talking to others Friends and family members can be sources of support and guidance. Talk to trusted friends or family members about your condition. Explain your symptoms to them, and let them know that you are working with a health care provider to treat your depression. Tell friends and family members how they also can be helpful.   Finances  Find appropriate mental health providers that fit with your financial situation.  Talk with your health care provider about options to get reduced prices on your medicines. Where to find more information You can find support in your area from:  Anxiety and Depression Association of America (ADAA): www.adaa.org  Mental Health America: www.mentalhealthamerica.net  [employer] on Mental Illness: www.nami.org Contact a health care provider if:  You stop taking your antidepressant medicines, and you have any of these  symptoms: ? Nausea. ? Headache. ? Light-headedness. ? Chills and body aches. ? Not being able to sleep (insomnia).  You or your friends and family think your depression is getting worse. Get help right away if:  You have thoughts of hurting yourself or others. If you ever feel like you may hurt yourself or others, or have thoughts about taking your own life, get help right away. Go to your nearest emergency department or:  Call your local emergency services (911 in the [place]  Call a suicide crisis helpline, such as the [place] at (941) 555-6865. This is open  24 hours a day in the [place]  Text the Crisis Text Line at 305-680-3008 (in the [place].). Summary  If you are diagnosed with depression, preparing yourself to manage your symptoms is a good way to feel positive about your future.  Work with your health care provider on a management plan that includes stress reduction techniques, medicines (if applicable), therapy, and healthy lifestyle habits.  Keep talking with your health care provider about how your treatment is working.  If you have thoughts about taking your own life, call a suicide crisis helpline or text a crisis text line. This information is not intended to replace advice given to you by your health care provider. Make sure you discuss any questions you have with your health care provider. Document Revised: 03/15/2019 Document Reviewed: 03/15/2019 Elsevier Patient Education  2021 [employer].

## 2020-10-02 NOTE — Progress Notes (Signed)
Subjective:     Sonia Gibson is a 30 y.o. female and is here for a comprehensive physical exam. The patient reports increased anxiety.  Pt wants to restart a medication for anxiety.  Patient previously on Paxil but notes increased muscle tension/soreness in the back.  Patient inquires if there is anything she can take for increased vaginal odor prior to menses.  Patient denies irritation or discharge.  Last Pap 11/04/2017.  LMP was a few wks ago, next cycle due around the beginning of June.  Pt requesting refill on Valtrex.  Denies any recent outbreaks.  History of seropositive HSV testing.  Social History   Socioeconomic History  . Marital status: Single    Spouse name: Not on file  . Number of children: Not on file  . Years of education: Not on file  . Highest education level: Not on file  Occupational History  . Not on file  Tobacco Use  . Smoking status: Never Smoker  . Smokeless tobacco: Never Used  Vaping Use  . Vaping Use: Never used  Substance and Sexual Activity  . Alcohol use: No  . Drug use: No  . Sexual activity: Yes    Birth control/protection: Condom, Injection  Other Topics Concern  . Not on file  Social History Narrative  . Not on file   Social Determinants of Health   Financial Resource Strain: Not on file  Food Insecurity: Not on file  Transportation Needs: Not on file  Physical Activity: Not on file  Stress: Not on file  Social Connections: Not on file  Intimate Partner Violence: Not on file   Health Maintenance  Topic Date Due  . Hepatitis C Screening  Never done  . TETANUS/TDAP  Never done  . PAP SMEAR-Modifier  11/01/2020  . INFLUENZA VACCINE  12/16/2020  . HIV Screening  Completed  . HPV VACCINES  Aged Out    The following portions of the patient's history were reviewed and updated as appropriate: allergies, current medications, past family history, past medical history, past social history, past surgical history and problem list.  Review of  Systems Pertinent items noted in HPI and remainder of comprehensive ROS otherwise negative.   Objective:    BP 90/62 (BP Location: Left Arm, Patient Position: Sitting, Cuff Size: Normal)   Pulse 67   Temp 98.2 F (36.8 C) (Oral)   Ht 5' 4.25" (1.632 m)   Wt 105 lb 9.6 oz (47.9 kg)   LMP 09/22/2020   SpO2 99%   BMI 17.99 kg/m  General appearance: alert, cooperative and no distress Head: Normocephalic, without obvious abnormality, atraumatic Eyes: conjunctivae/corneas clear. PERRL, EOM's intact. Fundi benign. Ears: normal TM's and external ear canals both ears Nose: Nares normal. Septum midline. Mucosa normal. No drainage or sinus tenderness. Throat: lips, mucosa, and tongue normal; teeth and gums normal Neck: no adenopathy, no carotid bruit, no JVD, supple, symmetrical, trachea midline and thyroid not enlarged, symmetric, no tenderness/mass/nodules Lungs: clear to auscultation bilaterally Heart: regular rate and rhythm, S1, S2 normal, no murmur, click, rub or gallop Abdomen: soft, non-tender; bowel sounds normal; no masses,  no organomegaly Extremities: extremities normal, atraumatic, no cyanosis or edema Pulses: 2+ and symmetric Skin: Skin color, texture, turgor normal. No rashes or lesions Lymph nodes: Cervical, supraclavicular, and axillary nodes normal. Neurologic: Alert and oriented X 3, normal strength and tone. Normal symmetric reflexes. Normal coordination and gait   GU: Normal external female genitalia.  No lesions noted.  Normal vaginal rugae without discharge.  Cervix friable with thick yellowish clear discharge and bleeding with obtaining Pap.  No CMT.  No palpable mass.  Assessment:    Healthy female exam.      Plan:     Anticipatory guidance given including wearing seatbelts, smoke detectors in the home, increasing physical activity, increasing p.o. intake of water and vegetables. -will obtain labs -pap done this visit.  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  Given information about area [facility] providers -We will start Zoloft -Follow-up in 4-6 weeks, sooner if needed - Plan: sertraline (ZOLOFT) 25 MG tablet, TSH, free T4  Depression, major, single episode, mild (HCC) -PHQ 9 score 14 -Discussed counseling.  Given information about [facility] providers -We will start Zoloft -Follow-up in 4-6 weeks, sooner if needed  - Plan: sertraline (ZOLOFT) 25 MG tablet, TSH, free T4  Follow-up in 4-6 weeks, sooner if needed  Abbe [place], MD

## 2020-10-04 LAB — CYTOLOGY - PAP
Chlamydia: NEGATIVE
Comment: NEGATIVE
Comment: NEGATIVE
Comment: NEGATIVE
Comment: NORMAL
Diagnosis: NEGATIVE
High risk HPV: NEGATIVE
Neisseria Gonorrhea: NEGATIVE
Trichomonas: NEGATIVE

## 2020-10-04 NOTE — Progress Notes (Signed)
Patient reviewed results on MyChart. 

## 2020-11-21 ENCOUNTER — Other Ambulatory Visit (HOSPITAL_COMMUNITY): Payer: Self-pay

## 2021-01-08 ENCOUNTER — Other Ambulatory Visit (HOSPITAL_COMMUNITY): Payer: Self-pay

## 2021-01-08 ENCOUNTER — Telehealth: Payer: No Typology Code available for payment source | Admitting: Physician Assistant

## 2021-01-08 DIAGNOSIS — N760 Acute vaginitis: Secondary | ICD-10-CM | POA: Diagnosis not present

## 2021-01-08 DIAGNOSIS — B9689 Other specified bacterial agents as the cause of diseases classified elsewhere: Secondary | ICD-10-CM | POA: Diagnosis not present

## 2021-01-08 MED ORDER — CLINDAMYCIN PHOSPHATE 2 % VA CREA
1.0000 | TOPICAL_CREAM | Freq: Every day | VAGINAL | 0 refills | Status: DC
  Filled 2021-01-08 – 2021-02-05 (×2): qty 40, 7d supply, fill #0

## 2021-01-08 NOTE — Progress Notes (Signed)
Virtual Visit Consent   Sonia Gibson, you are scheduled for a virtual visit with a [facility] provider today.     Just as with appointments in the office, your consent must be obtained to participate.  Your consent will be active for this visit and any virtual visit you may have with one of our providers in the next 365 days.     If you have a MyChart account, a copy of this consent can be sent to you electronically.  All virtual visits are billed to your insurance company just like a traditional visit in the office.    As this is a virtual visit, video technology does not allow for your provider to perform a traditional examination.  This may limit your provider's ability to fully assess your condition.  If your provider identifies any concerns that need to be evaluated in person or the need to arrange testing (such as labs, EKG, etc.), we will make arrangements to do so.     Although advances in technology are sophisticated, we cannot ensure that it will always work on either your end or our end.  If the connection with a video visit is poor, the visit may have to be switched to a telephone visit.  With either a video or telephone visit, we are not always able to ensure that we have a secure connection.     I need to obtain your verbal consent now.   Are you willing to proceed with your visit today?    Sonia Gibson has provided verbal consent on 01/08/2021 for a virtual visit (video or telephone).   Piedad Climes, [place]   Date: 01/08/2021 5:13 PM   Virtual Visit via Video Note   I, Piedad Climes, connected with  Sonia Gibson  ([id number], 12-02-1990) on 01/08/21 at  5:15 PM EDT by a video-enabled telemedicine application and verified that I am speaking with the correct person using two identifiers.  Location: Patient: Virtual Visit Location Patient: Home Provider: Virtual Visit Location Provider: Home Office   I discussed the limitations of evaluation and management by  telemedicine and the availability of in person appointments. The patient expressed understanding and agreed to proceed.    History of Present Illness: Sonia Gibson is a 30 y.o. who identifies as a female who was assigned female at birth, and is being seen today for possible bacterial vaginosis following her menstrual period. Notes she gets this often right after period. Now with fishy odor and discharge. Denies concern for STI.   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 Insert 1 Applicatorful of medication into the vagina once nightly x 7 nights, Disp: 35 g, Rfl: 0   cetirizine (ZYRTEC) 10 MG tablet, Take 1 tablet (10 mg total) by mouth daily., Disp: 30 tablet, Rfl: 11   sertraline (ZOLOFT) 25 MG tablet, Take 1 tablet (25 mg total) by mouth daily., Disp: 30 tablet, Rfl: 3   valACYclovir (VALTREX) 1000 MG tablet, Take 1 tablet (1,000 mg total) by mouth daily., Disp: 90 tablet, Rfl: 3  Observations/Objective: Patient is well-developed, well-nourished in no acute distress.  Resting comfortably at home.  Head is normocephalic, atraumatic.  No labored  breathing. Speech is clear and coherent with logical content.  Patient is alert and oriented at baseline.   Assessment and Plan: 1. Bacterial vaginosis - clindamycin (CLEOCIN) 2 % vaginal cream; Place 1 Applicatorful vaginally at bedtime. Insert 1 Applicatorful of medication into the vagina once nightly x 7 nights  Dispense: 35 g; Refill: 0 Rx cleocin cream to apply vaginally as directed for 7 nights. Supportive measures and OTC medications discussed. Needs to reach out to her PCP to see about a GYN consult.   Follow Up  Instructions: I discussed the assessment and treatment plan with the patient. The patient was provided an opportunity to ask questions and all were answered. The patient agreed with the plan and demonstrated an understanding of the instructions.  A copy of instructions were sent to the patient via MyChart.  The patient was advised to call back or seek an in-person evaluation if the symptoms worsen or if the condition fails to improve as anticipated.  Time:  I spent 12 minutes with the patient via telehealth technology discussing the above problems/concerns.    Piedad Climes, PA-C

## 2021-01-08 NOTE — Patient Instructions (Signed)
[name], thank you for joining Piedad Climes, PA-C for today's virtual visit.  While this provider is not your primary care provider (PCP), if your PCP is located in our provider database this encounter information will be shared with them immediately following your visit.  Consent: (Patient) Sonia Gibson provided verbal consent for this virtual visit at the beginning of the encounter.  Current Medications:  Current Outpatient Medications:    cetirizine (ZYRTEC) 10 MG tablet, Take 1 tablet (10 mg total) by mouth daily., Disp: 30 tablet, Rfl: 11   metroNIDAZOLE (METROGEL) 0.75 % gel, Insert one applicatorful (5g) of medicine into the vagina once nightly x 5 days, Disp: 25 g, Rfl: 0   metroNIDAZOLE (METROGEL) 0.75 % vaginal gel, INSERT ONE APPLICATORFUL (5G) OF MEDICINE INTO THE VAGINA ONCE NIGHTLY X 5 DAYS, Disp: 45 g, Rfl: 0   sertraline (ZOLOFT) 25 MG tablet, Take 1 tablet (25 mg total) by mouth daily., Disp: 30 tablet, Rfl: 3   valACYclovir (VALTREX) 1000 MG tablet, Take 1 tablet (1,000 mg total) by mouth daily., Disp: 90 tablet, Rfl: 3   Medications ordered in this encounter:  No orders of the defined types were placed in this encounter.    *If you need refills on other medications prior to your next appointment, please contact your pharmacy*  Follow-Up: Call back or seek an in-person evaluation if the symptoms worsen or if the condition fails to improve as anticipated.  Other Instructions Please apply the Cleocin cream as directed for active infection. Make sure to keep a clean diet and avoid soaps/lotions/lubricants, etc used in the area that are scented or dyed. Since this always seems to follow your menstrual cycle, you can use OTC boric acid suppositories (AZO is one brand) to see if this helps, but I recommend discussing with a GYN first.   Bacterial Vaginosis  Bacterial vaginosis is an infection of the vagina. It happens when too many normal germs (healthy  bacteria) grow in the vagina. This infection can make it easier to get other infectionsfrom sex (STIs). It is very important for pregnant women to get treated. This infection cancause babies to be born early or at a low birth weight. What are the causes? This infection is caused by an increase in certain germs that grow in [place]. You cannot get this infection from toilet seats, bedsheets, swimming pools, orthings that touch your vagina. What increases the risk? Having sex with a new person or more than one person. Having sex without protection. Douching. Having an intrauterine device (IUD). Smoking. Using drugs or drinking alcohol. These can lead you to do things that are risky. Taking certain antibiotic medicines. Being pregnant. What are the signs or symptoms? Some women have no symptoms. Symptoms may include: A discharge from your vagina. It may be gray or white. It can be watery or foamy. A fishy smell. This can happen after sex or during your menstrual period. Itching in and around your vagina. A feeling of burning or pain when you pee (urinate). How is this treated? This infection is treated with antibiotic medicines. These may be given to you as: A pill. A cream for your vagina. A medicine that you put into your vagina (suppository). If the infection comes back after treatment, you may need more antibiotics. Follow these instructions at home: Medicines Take over-the-counter and prescription medicines as told by your doctor. Take or use your antibiotic medicine as told by your doctor. Do not stop taking or using it, even if  you start to feel better. General instructions If the person you have sex with is a woman, tell her that you have this infection. She will need to follow up with her doctor. If you have a female partner, he does not need to be treated. Do not have sex until you finish treatment. Drink enough fluid to keep your pee pale yellow. Keep your vagina and butt  clean. Wash the area with warm water each day. Wipe from front to back after you use the toilet. If you are breastfeeding a baby, ask your doctor if you should keep doing so during treatment. Keep all follow-up visits. How is this prevented? Self-care Do not douche. Use only warm water to wash around your vagina. Wear underwear that is cotton or lined with cotton. Do not wear tight pants and pantyhose, especially in the summer. Safe sex Use protection when you have sex. This includes: Use condoms. Use dental dams. This is a thin layer that protects the mouth during oral sex. Limit how many people you have sex with. To prevent this infection, it is best to have sex with just one person. Get tested for STIs. The person you have sex with should also get tested. Drugs and alcohol Do not smoke or use any products that contain nicotine or tobacco. If you need help quitting, ask your doctor. Do not use drugs. Do not drink alcohol if: Your doctor tells you not to drink. You are pregnant, may be pregnant, or are planning to become pregnant. If you drink alcohol: Limit how much you have to 0-1 drink a day. Know how much alcohol is in your drink. In the [place] one drink equals one 12 oz bottle of beer (355 mL), one 5 oz glass of wine (148 mL), or one 1 oz glass of hard liquor (44 mL). Where to find more information Centers for Disease Control and Prevention: FootballExhibition.com.br American Sexual Health Association: www.ashastd.org Office on [employer] Health: http://hoffman.com/ Contact a doctor if: Your symptoms do not get better, even after you are treated. You have more discharge or pain when you pee. You have a fever or chills. You have pain in your belly (abdomen) or in the area between your hips. You have pain with sex. You bleed from your vagina between menstrual periods. Summary This infection can happen when too many germs (bacteria) grow in the vagina. This infection can make it easier to  get infections from sex (STIs). Treating this can lower that chance. Get treated if you are pregnant. This infection can cause babies to be born early. Do not stop taking or using your antibiotic medicine, even if you start to feel better. This information is not intended to replace advice given to you by your health care provider. Make sure you discuss any questions you have with your healthcare provider. Document Revised: 11/02/2019 Document Reviewed: 11/02/2019 Elsevier Patient Education  2022 [employer].    If you have been instructed to have an in-person evaluation today at a local Urgent Care facility, please use the link below. It will take you to a list of all of our available [facility], including address, phone number and hours of operation. Please do not delay care.  [facility]  If you or a family member do not have a primary care provider, use the link below to schedule a visit and establish care. When you choose a [facility] primary care physician or advanced practice provider, you gain a long-term partner  in health. Find a Primary Care Provider  Learn more about 's in-office and virtual care options: [facility]

## 2021-01-09 ENCOUNTER — Other Ambulatory Visit (HOSPITAL_COMMUNITY): Payer: Self-pay

## 2021-01-14 ENCOUNTER — Encounter: Payer: Self-pay | Admitting: Family Medicine

## 2021-01-16 ENCOUNTER — Other Ambulatory Visit (HOSPITAL_COMMUNITY): Payer: Self-pay

## 2021-01-30 ENCOUNTER — Encounter: Payer: Self-pay | Admitting: Family Medicine

## 2021-01-30 ENCOUNTER — Ambulatory Visit (INDEPENDENT_AMBULATORY_CARE_PROVIDER_SITE_OTHER): Payer: No Typology Code available for payment source | Admitting: Family Medicine

## 2021-01-30 ENCOUNTER — Other Ambulatory Visit: Payer: Self-pay

## 2021-01-30 ENCOUNTER — Other Ambulatory Visit (HOSPITAL_COMMUNITY)
Admission: RE | Admit: 2021-01-30 | Discharge: 2021-01-30 | Disposition: A | Discharge status: Home / Self Care | Payer: No Typology Code available for payment source | Source: Ambulatory Visit | Attending: Family Medicine | Admitting: Family Medicine

## 2021-01-30 ENCOUNTER — Other Ambulatory Visit (HOSPITAL_COMMUNITY): Payer: Self-pay

## 2021-01-30 VITALS — BP 100/68 | HR 75 | Temp 98.6°F | Wt 109.0 lb

## 2021-01-30 DIAGNOSIS — F321 Major depressive disorder, single episode, moderate: Secondary | ICD-10-CM

## 2021-01-30 DIAGNOSIS — Z113 Encounter for screening for infections with a predominantly sexual mode of transmission: Secondary | ICD-10-CM

## 2021-01-30 DIAGNOSIS — Z30011 Encounter for initial prescription of contraceptive pills: Secondary | ICD-10-CM | POA: Diagnosis not present

## 2021-01-30 DIAGNOSIS — F419 Anxiety disorder, unspecified: Secondary | ICD-10-CM

## 2021-01-30 DIAGNOSIS — R768 Other specified abnormal immunological findings in serum: Secondary | ICD-10-CM | POA: Diagnosis not present

## 2021-01-30 MED ORDER — VALACYCLOVIR HCL 1 G PO TABS
1000.0000 mg | ORAL_TABLET | Freq: Every day | ORAL | 3 refills | Status: AC
  Filled 2021-05-13 – 2021-05-28 (×2): qty 30, 30d supply, fill #0
  Filled 2021-07-02: qty 30, 30d supply, fill #1
  Filled 2021-08-25: qty 30, 30d supply, fill #2

## 2021-01-30 MED ORDER — NORGESTIM-ETH ESTRAD TRIPHASIC 0.18/0.215/0.25 MG-25 MCG PO TABS
1.0000 | ORAL_TABLET | Freq: Every day | ORAL | 11 refills | Status: DC
  Filled 2021-01-30: qty 28, 28d supply, fill #0

## 2021-01-30 MED ORDER — SERTRALINE HCL 25 MG PO TABS
25.0000 mg | ORAL_TABLET | Freq: Every day | ORAL | 3 refills | Status: DC
  Filled 2021-01-30: qty 30, 30d supply, fill #0

## 2021-01-30 NOTE — Progress Notes (Signed)
Subjective:    Patient ID: Sonia Gibson, female    DOB: 10/04/1990, 30 y.o.   MRN: [id number]  Chief Complaint  Patient presents with   Depression    Depression and FMLA paperwork    HPI Patient was seen today for follow-up on ongoing concern.  Patient last seen 10/02/2020 for anxiety, depression.  Started on Zoloft 25 mg daily and advised to follow-up in 4-6 weeks.  Since that visit patient states she started taking the medicine 2 times per week as it caused her to feel drowsy for 2 hours in the morning after taking.  Patient notes increased anxiety and depression symptoms.  States is unhappy with current situation and trying to figure out what she wants to do in life.  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  Patient considering counseling.  Pt inquires about refills.  Seropositive for HSV with no outbreaks.  Request STI testing.  Also interested in starting OCPs.  Previously had headaches with the patch and continuous bleeding with Depo.  Denies history of migraines, tobacco use, DVT, or PEs.  Past Medical History:  Diagnosis Date   BV (bacterial vaginosis) 05/03/2014   Medical history non-contributory    Vaginal discharge 05/03/2014    No Known Allergies  ROS General: Denies fever, chills, night sweats, changes in weight, changes in appetite HEENT: Denies headaches, ear pain, changes in vision, rhinorrhea, sore throat CV: Denies CP, palpitations, SOB, orthopnea Pulm: Denies SOB, cough, wheezing GI: Denies abdominal pain, nausea, vomiting, diarrhea, constipation GU: Denies dysuria, hematuria, frequency, vaginal discharge Msk: Denies muscle cramps, joint pains Neuro: Denies weakness, numbness, tingling Skin: Denies rashes, bruising Psych: Denies hallucinations  + anxiety, depression, stress    Objective:    Blood pressure 100/68, pulse 75, temperature 98.6 F (37 C), temperature source Oral, weight 109 lb (49.4 kg), SpO2 98 %.  Gen. Pleasant,  well-nourished, in no distress, normal affect   HEENT: Delta/AT, face symmetric, conjunctiva clear, no scleral icterus, PERRLA, EOMI, nares patent without drainage Lungs: no accessory muscle use Cardiovascular: RRR, no peripheral edema Musculoskeletal: No deformities, no cyanosis or clubbing, normal tone Neuro:  A&Ox3, CN II-XII intact, normal gait Skin:  Warm, no lesions/ rash   Wt Readings from Last 3 Encounters:  01/30/21 109 lb (49.4 kg)  10/02/20 105 lb 9.6 oz (47.9 kg)  11/29/19 109 lb (49.4 kg)    Lab Results  Component Value Date   WBC 4.1 10/02/2020   HGB 13.7 10/02/2020   HCT 40.2 10/02/2020   PLT 211.0 10/02/2020   GLUCOSE 76 10/02/2020   CHOL 145 10/02/2020   TRIG 57.0 10/02/2020   HDL 47.70 10/02/2020   LDLCALC 86 10/02/2020   ALT 7 04/15/2016   AST 14 04/15/2016   NA 141 10/02/2020   K 3.8 10/02/2020   CL 105 10/02/2020   CREATININE 0.69 10/02/2020   BUN 7 10/02/2020   CO2 28 10/02/2020   TSH 1.81 10/02/2020   HGBA1C 5.3 10/02/2020   Depression screen PHQ 2/9 01/30/2021 10/02/2020 11/29/2019  Decreased Interest 2 1 1   Down, Depressed, Hopeless 3 2 1   PHQ - 2 Score 5 3 2   Altered sleeping 3 3 3   Tired, decreased energy 2 2 1   Change in appetite 2 2 1   Feeling bad or failure about yourself  2 2 2   Trouble concentrating 2 1 1   Moving slowly or fidgety/restless 2 1 0  Suicidal thoughts 0 0 0  PHQ-9 Score 18 14 10  Difficult doing work/chores Somewhat difficult - Somewhat difficult   GAD 7 : Generalized Anxiety Score 01/30/2021 10/02/2020 11/29/2019 09/15/2019  Nervous, Anxious, on Edge 3 3 1 3   Control/stop worrying 3 3 2 3   Worry too much - different things 3 3 2 3   Trouble relaxing 2 2 1 3   Restless 2 2 0 3  Easily annoyed or irritable 3 3 1 3   Afraid - awful might happen 1 3 1 3   Total GAD 7 Score 17 19 8 21   Anxiety Difficulty Very difficult - Somewhat difficult Very difficult    Assessment/Plan:  Routine screening for STI (sexually transmitted  infection) - Plan: Cervicovaginal ancillary only, HIV Antibody (routine testing w rflx), RPR  HSV-2 seropositive  - Plan: valACYclovir (VALTREX) 1000 MG tablet  Anxiety  -GAD-7 score 17 -Counseling strongly encouraged.  Given resources -Will consider completing FMLA papers when taking Zoloft consistently and in counseling. - Plan: sertraline (ZOLOFT) 25 MG tablet  Depression, major, single episode, moderate (HCC)  -PHQ-9 score 18 -Patient encouraged to schedule appointment with counseling or psychiatry.  Given resources -Given precautions - Plan: sertraline (ZOLOFT) 25 MG tablet  Encounter for initial prescription of contraceptive pills - Plan: Norgestimate-Ethinyl Estradiol Triphasic (ORTHO TRI-CYCLEN LO) 0.18/0.215/0.25 MG-25 MCG tab  F/u 4-6 weeks for anxiety and depression  , MD

## 2021-01-30 NOTE — Patient Instructions (Addendum)
Behavioral Health Services: -to make an appointment contact the office/provider you are interested in seeing.  No referral is needed.  The below is not an all inclusive list, but will help you get started.  [email or web address] -counseling located off of [address]  [email or web address] -website helps you find providers in your area  [facility] -Located off of [address] across from [address]  Dr. Akintayo is a Psychiatrist with [place]. [phone]  [facility]  [facility]  -[address]  [phone] -a place in town that has counseling and Psychiatry services.    

## 2021-01-31 ENCOUNTER — Other Ambulatory Visit (HOSPITAL_COMMUNITY): Payer: Self-pay

## 2021-01-31 LAB — HIV ANTIBODY (ROUTINE TESTING W REFLEX): HIV 1&2 Ab, 4th Generation: NONREACTIVE

## 2021-01-31 LAB — RPR: RPR Ser Ql: NONREACTIVE

## 2021-02-03 LAB — CERVICOVAGINAL ANCILLARY ONLY
Bacterial Vaginitis (gardnerella): POSITIVE — AB
Candida Glabrata: NEGATIVE
Candida Vaginitis: NEGATIVE
Chlamydia: NEGATIVE
Comment: NEGATIVE
Comment: NEGATIVE
Comment: NEGATIVE
Comment: NEGATIVE
Comment: NEGATIVE
Comment: NORMAL
Neisseria Gonorrhea: NEGATIVE
Trichomonas: NEGATIVE

## 2021-02-04 ENCOUNTER — Other Ambulatory Visit (HOSPITAL_COMMUNITY): Payer: Self-pay

## 2021-02-05 ENCOUNTER — Other Ambulatory Visit (HOSPITAL_COMMUNITY): Payer: Self-pay

## 2021-02-05 ENCOUNTER — Other Ambulatory Visit: Payer: Self-pay | Admitting: Family Medicine

## 2021-02-05 ENCOUNTER — Encounter: Payer: Self-pay | Admitting: Family Medicine

## 2021-02-05 DIAGNOSIS — B9689 Other specified bacterial agents as the cause of diseases classified elsewhere: Secondary | ICD-10-CM

## 2021-02-05 DIAGNOSIS — N760 Acute vaginitis: Secondary | ICD-10-CM

## 2021-02-05 MED ORDER — METRONIDAZOLE 500 MG PO TABS
500.0000 mg | ORAL_TABLET | Freq: Two times a day (BID) | ORAL | 0 refills | Status: AC
Start: 2021-02-05 — End: 2021-02-12

## 2021-02-10 ENCOUNTER — Other Ambulatory Visit (HOSPITAL_COMMUNITY): Payer: Self-pay

## 2021-02-10 ENCOUNTER — Encounter: Payer: Self-pay | Admitting: Family Medicine

## 2021-02-10 MED ORDER — METRONIDAZOLE 0.75 % VA GEL
1.0000 | Freq: Two times a day (BID) | VAGINAL | 0 refills | Status: DC
Start: 2021-02-10 — End: 2021-07-05

## 2021-02-13 ENCOUNTER — Other Ambulatory Visit (HOSPITAL_COMMUNITY): Payer: Self-pay

## 2021-03-07 ENCOUNTER — Ambulatory Visit: Payer: No Typology Code available for payment source | Admitting: Family Medicine

## 2021-03-10 ENCOUNTER — Ambulatory Visit (HOSPITAL_COMMUNITY): Payer: Self-pay | Admitting: Clinical

## 2021-03-12 ENCOUNTER — Other Ambulatory Visit (HOSPITAL_COMMUNITY): Payer: Self-pay

## 2021-03-12 ENCOUNTER — Encounter: Payer: Self-pay | Admitting: Family Medicine

## 2021-03-12 ENCOUNTER — Telehealth (INDEPENDENT_AMBULATORY_CARE_PROVIDER_SITE_OTHER): Payer: No Typology Code available for payment source | Admitting: Family Medicine

## 2021-03-12 DIAGNOSIS — F321 Major depressive disorder, single episode, moderate: Secondary | ICD-10-CM

## 2021-03-12 DIAGNOSIS — G44209 Tension-type headache, unspecified, not intractable: Secondary | ICD-10-CM | POA: Diagnosis not present

## 2021-03-12 DIAGNOSIS — F419 Anxiety disorder, unspecified: Secondary | ICD-10-CM

## 2021-03-12 MED ORDER — ESCITALOPRAM OXALATE 10 MG PO TABS
10.0000 mg | ORAL_TABLET | Freq: Every day | ORAL | 2 refills | Status: DC
Start: 2021-03-12 — End: 2021-06-13
  Filled 2021-03-12: qty 30, 30d supply, fill #0

## 2021-03-12 NOTE — Progress Notes (Signed)
Virtual Visit via Telephone Note  I connected with Sonia Gibson on 03/12/21 at  4:30 PM EDT by telephone and verified that I am speaking with the correct person using two identifiers.   I discussed the limitations, risks, security and privacy concerns of performing an evaluation and management service by telephone and the availability of in person appointments. I also discussed with the patient that there may be a patient responsible charge related to this service. The patient expressed understanding and agreed to proceed.  Location patient: home Location provider: work or home office Participants present for the call: patient, provider Patient did not have a visit in the prior 7 days to address this/these issue(s).   History of Present Illness: Pt with fatigue, dizziness, and HAs since 10/17.  Occurs randomly.  Woke up one am with the symptoms when they started.  Pt notes increased tension in posterior neck and occipital area of head.  Pt stopped taking Zoloft 3 wks ago due to drowsiness.  Pt has an appointment for counseling next month.  Interested in starting Lexapro.  Endorses increased stress but not sure what to do about it.  HAs an appt with Ophthalmology next month.    Observations/Objective: Patient sounds cheerful and well on the phone. I do not appreciate any SOB. Speech and thought processing are grossly intact. Patient reported vitals:   Assessment and Plan: Anxiety - Plan: escitalopram (LEXAPRO) 10 MG tablet  Depression, major, single episode, moderate (HCC) - Plan: escitalopram (LEXAPRO) 10 MG tablet  Tension headache  Zoloft d/c'd 2/2 drowsiness.  Will start Lexapro 10 mg daily.  Discussed self care and ways to practice it.  Continue writing in journal.  Encouraged to keep appt with counselor.  Given precautions.  Headaches and dizziness likely 2/2 decreased po intake of water and stress.  Po hydration strongly encouraged.  Pt advised to consider walking for exercise.  Tylenol or NSAIDs prn.  Avoid caffeine.    Follow Up Instructions:  F/u in 4-6 wks   [id number] 5-10 [id number] 11-20 [id number] 21-30 I did not refer this patient for an OV in the next 24 hours for this/these issue(s).  I discussed the assessment and treatment plan with the patient. The patient was provided an opportunity to ask questions and all were answered. The patient agreed with the plan and demonstrated an understanding of the instructions.   The patient was advised to call back or seek an in-person evaluation if the symptoms worsen or if the condition fails to improve as anticipated.  I provided 10 minutes of non-face-to-face time during this encounter.   Sonia Saint, MD

## 2021-03-13 ENCOUNTER — Other Ambulatory Visit (HOSPITAL_COMMUNITY): Payer: Self-pay

## 2021-03-13 ENCOUNTER — Telehealth: Payer: No Typology Code available for payment source | Admitting: Family Medicine

## 2021-03-18 ENCOUNTER — Encounter: Payer: Self-pay | Admitting: Family Medicine

## 2021-03-18 ENCOUNTER — Other Ambulatory Visit: Payer: Self-pay

## 2021-03-18 ENCOUNTER — Ambulatory Visit (INDEPENDENT_AMBULATORY_CARE_PROVIDER_SITE_OTHER): Payer: Self-pay | Admitting: Clinical

## 2021-03-18 ENCOUNTER — Encounter (HOSPITAL_COMMUNITY): Payer: Self-pay

## 2021-03-18 DIAGNOSIS — F419 Anxiety disorder, unspecified: Secondary | ICD-10-CM

## 2021-03-18 DIAGNOSIS — F331 Major depressive disorder, recurrent, moderate: Secondary | ICD-10-CM
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
## 2021-03-18 NOTE — Plan of Care (Signed)
Verbal Consent 

## 2021-03-26 ENCOUNTER — Telehealth (INDEPENDENT_AMBULATORY_CARE_PROVIDER_SITE_OTHER): Payer: No Typology Code available for payment source | Admitting: Family Medicine

## 2021-03-26 ENCOUNTER — Encounter: Payer: Self-pay | Admitting: Family Medicine

## 2021-03-26 DIAGNOSIS — G44209 Tension-type headache, unspecified, not intractable: Secondary | ICD-10-CM

## 2021-03-26 DIAGNOSIS — F321 Major depressive disorder, single episode, moderate: Secondary | ICD-10-CM

## 2021-03-26 DIAGNOSIS — F419 Anxiety disorder, unspecified: Secondary | ICD-10-CM

## 2021-03-26 NOTE — Telephone Encounter (Signed)
Patient has virtual visit today, 11/9.

## 2021-03-26 NOTE — Progress Notes (Signed)
Virtual Visit via Telephone Note  I connected with Sonia Gibson on 03/26/21 at  4:00 PM EST by telephone and verified that I am speaking with the correct person using two identifiers.   I discussed the limitations, risks, security and privacy concerns of performing an evaluation and management service by telephone and the availability of in person appointments. I also discussed with the patient that there may be a patient responsible charge related to this service. The patient expressed understanding and agreed to proceed.  Location patient: home Location provider: work or home office Participants present for the call: patient, provider Patient did not have a visit in the prior 7 days to address this/these issue(s).   History of Present Illness: Pt seen for f/u on FMLA papers.  States was encouraged by her supervisor to have them done for her current anxiety/depression sx.  Plans to start seeing a counselor bi monthly.  Had first intake visit 11/1.  Next appt 11/17.  Feeling ok on lexapro, on x 2 wks.  Better than when was on zoloft.  Taking melatonin to help with sleep.  Thinks med is giving her loose stools, 2-3 x /day.  Had a HA on Monday that started on Sunday.  Did not take any meds for the HA.   Observations/Objective: Patient sounds cheerful and well on the phone. I do not appreciate any SOB. Speech and thought processing are grossly intact. Patient reported vitals:  Assessment and Plan: Anxiety  Depression, major, single episode, moderate (HCC)  Tension headache  -Continue Lexapro 10 mg daily. -Will wait until it has been 4-6 wks on med to assess need for dose adjustment.   -Pt to start counseling next wk -continue to work on stress reduction -will complete FMLA forms -given strict precautions  Follow Up Instructions: F/u in 2-4 wks   99833 5-10 [id number] 11-20 9443 21-30 I did not refer this patient for an OV in the next 24 hours for this/these issue(s).  I  discussed the assessment and treatment plan with the patient. The patient was provided an opportunity to ask questions and all were answered. The patient agreed with the plan and demonstrated an understanding of the instructions.   The patient was advised to call back or seek an in-person evaluation if the symptoms worsen or if the condition fails to improve as anticipated.  I provided 12 minutes of non-face-to-face time during this encounter.   Deeann Saint, MD

## 2021-03-31 ENCOUNTER — Other Ambulatory Visit: Payer: Self-pay

## 2021-04-03 ENCOUNTER — Other Ambulatory Visit: Payer: Self-pay

## 2021-04-03 ENCOUNTER — Ambulatory Visit (HOSPITAL_COMMUNITY): Payer: Self-pay | Admitting: Clinical

## 2021-05-01 ENCOUNTER — Ambulatory Visit (INDEPENDENT_AMBULATORY_CARE_PROVIDER_SITE_OTHER): Payer: Self-pay | Admitting: Clinical

## 2021-05-01 ENCOUNTER — Other Ambulatory Visit: Payer: Self-pay

## 2021-05-01 DIAGNOSIS — F419 Anxiety disorder, unspecified: Secondary | ICD-10-CM

## 2021-05-01 DIAGNOSIS — F331 Major depressive disorder, recurrent, moderate: Secondary | ICD-10-CM

## 2021-05-01 NOTE — Progress Notes (Signed)
Virtual Visit via Telephone Note  I connected with Sonia Gibson on 05/01/21 at  3:00 PM EST by telephone and verified that I am speaking with the correct person using two identifiers.  Location: Patient: Home Provider: Office   I discussed the limitations, risks, security and privacy concerns of performing an evaluation and management service by telephone and the availability of in person appointments. I also discussed with the patient that there may be a patient responsible charge related to this service. The patient expressed understanding and agreed to proceed.  THERAPIST PROGRESS NOTE   Session Time: 3:00 PM-3:30PM   Participation Level: Active   Behavioral Response: CasualAlertDepressed   Type of Therapy: Individual Therapy   Treatment Goals addressed: Coping   Interventions: CBT, DBT, Solution Focused, Strength-based and Supportive   Summary: Sonia Gibson is a 30 y.o. female who presents with Depression and Anxiety. The OPT therapist worked with the patient for her OPT treatment. The OPT therapist utilized Motivational Interviewing to assist in creating therapeutic repore. The patient in the session was engaged and work in Tour manager about the stress of the upcoming Christmas Holiday. The OPT therapist utilized Cognitive Behavioral Therapy through cognitive restructuring as well as worked with the patient on coping strategies to assist in management of mood including ongoing motivation and empowerment for the patient to be as active. 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 The OPT therapist continued to support and encourage the patient utilize her coping including reading bible, journaling, and praying.     Suicidal/Homicidal: Nowithout intent/plan   Therapist Response: The OPT therapist worked with the patient for the patients scheduled session. 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 The patient identified Christmas holiday upcoming as a stressor. The patient did note as part of a Christmas present from her Grandmother she is going to her favorite resterant.The OPT therapist worked with the patient utilizing an in session Cognitive Behavioral Therapy exercise. The patient was responsive in the session and verbalized, " I have been using my coping skills and this past week has been better". The OPT therapist will continue treatment work with the patient in her next scheduled session.      Plan: Return again in 2 weeks.   Diagnosis:      Axis I: Major depressive disorder, recurrent episode, moderate with anxious distress                            Axis II: No diagnosis   I discussed the assessment and treatment plan with the patient. The patient was provided an opportunity to ask questions and all were answered. The patient agreed with the plan and demonstrated an understanding of the instructions.   The patient was advised to call back or seek an in-person evaluation if the symptoms worsen or if the condition fails to improve as anticipated.   I provided 30 minutes of non-face-to-face time during this encounter.   Winfred Burn, LCSW   05/01/2021

## 2021-05-14 ENCOUNTER — Other Ambulatory Visit (HOSPITAL_COMMUNITY): Payer: Self-pay

## 2021-05-22 ENCOUNTER — Other Ambulatory Visit (HOSPITAL_COMMUNITY): Payer: Self-pay

## 2021-05-27 ENCOUNTER — Ambulatory Visit (INDEPENDENT_AMBULATORY_CARE_PROVIDER_SITE_OTHER): Payer: Self-pay | Admitting: Clinical

## 2021-05-27 ENCOUNTER — Other Ambulatory Visit: Payer: Self-pay

## 2021-05-27 DIAGNOSIS — F331 Major depressive disorder, recurrent, moderate: Secondary | ICD-10-CM

## 2021-05-27 DIAGNOSIS — F419 Anxiety disorder, unspecified: Secondary | ICD-10-CM

## 2021-05-27 NOTE — Progress Notes (Signed)
Virtual Visit via Telephone Note   I connected with Sonia Gibson on 05/27/21 at  1:00 PM EST by telephone and verified that I am speaking with the correct person using two identifiers.   Location: Patient: Home Provider: Office   I discussed the limitations, risks, security and privacy concerns of performing an evaluation and management service by telephone and the availability of in person appointments. I also discussed with the patient that there may be a patient responsible charge related to this service. The patient expressed understanding and agreed to proceed.   THERAPIST PROGRESS NOTE   Session Time: 1:00 PM-1:30PM   Participation Level: Active   Behavioral Response: CasualAlertDepressed   Type of Therapy: Individual Therapy   Treatment Goals addressed: Coping   Interventions: CBT, DBT, Solution Focused, Strength-based and Supportive   Summary: Sonia Gibson is a 31 y.o. female who presents with Depression and Anxiety. The OPT therapist worked with the patient for her OPT treatment. The OPT therapist utilized Motivational Interviewing to assist in creating therapeutic repore. The patient in the session was engaged and work in Secretary/administrator giving feedback about her Christmas Holiday. The OPT therapist utilized Cognitive Behavioral Therapy through cognitive restructuring as well as worked with the patient on coping strategies to assist in management of mood including ongoing motivation and empowerment for the patient to be as active. The patient spoke about looking into getting back into school. The OPT therapist continued to support and encourage the patient utilize her coping including reading bible, journaling, and praying.     Suicidal/Homicidal: Nowithout intent/plan   Therapist Response: The OPT therapist worked with the patient for the patients scheduled session. 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 The patient reviewed Christmas holidays.The OPT therapist worked with the patient utilizing an in session Cognitive Behavioral Therapy exercise. The patient was responsive in the session and verbalized, " I am going to work on my basic needs". The OPT therapist will continue treatment work with the patient in her next scheduled session.      Plan: Return again in 2 weeks.   Diagnosis:      Axis I: Major depressive disorder, recurrent episode, moderate with anxious distress                            Axis II: No diagnosis   I discussed the assessment and treatment plan with the patient. The patient was provided an opportunity to ask questions and all were answered. The patient agreed with the plan and demonstrated an understanding of the instructions.   The patient was advised to call back or seek an in-person evaluation if the symptoms worsen or if the condition fails to improve as anticipated.   I provided 30 minutes of non-face-to-face time during this encounter.   Sonia Burn, LCSW   05/27/2021

## 2021-05-28 ENCOUNTER — Other Ambulatory Visit (HOSPITAL_COMMUNITY): Payer: Self-pay

## 2021-06-13 ENCOUNTER — Telehealth (INDEPENDENT_AMBULATORY_CARE_PROVIDER_SITE_OTHER): Payer: No Typology Code available for payment source | Admitting: Family Medicine

## 2021-06-13 ENCOUNTER — Encounter: Payer: Self-pay | Admitting: Family Medicine

## 2021-06-13 VITALS — Wt 112.0 lb

## 2021-06-13 DIAGNOSIS — F419 Anxiety disorder, unspecified: Secondary | ICD-10-CM | POA: Diagnosis not present

## 2021-06-13 DIAGNOSIS — F331 Major depressive disorder, recurrent, moderate: Secondary | ICD-10-CM

## 2021-06-13 NOTE — Progress Notes (Signed)
Virtual Visit via Telephone Note  I connected with Sonia Gibson on 06/13/21 at  4:00 PM EST by telephone and verified that I am speaking with the correct person using two identifiers.   I discussed the limitations, risks, security and privacy concerns of performing an evaluation and management service by telephone and the availability of in person appointments. I also discussed with the patient that there may be a patient responsible charge related to this service. The patient expressed understanding and agreed to proceed.  Location patient: home Location provider: work or home office Participants present for the call: patient, provider Patient did not have a visit in the prior 7 days to address this/these issue(s).   History of Present Illness: Pt taking lexapro, but noticed her hair was falling out. Started taking an OTC stress/anxiety med that seems to be working. Taking Melatonin at night.  Getting 6-7 hours of sleep per night.  Mood depends on the day.  Felt overwhelmed at times.  Had a few days with episodes of crying this month.  Wasn't sure if it was related to her cycle.  Appetite up and down.  May drinking a supplemental shake.  Typically has no appetite. Realizes she hasn't eaten when she starts feeling effects of hypoglycemia.   Observations/Objective: Patient sounds cheerful and well on the phone. I do not appreciate any SOB. Speech and thought processing are grossly intact. Patient reported vitals:  Assessment and Plan: Recurrent moderate major depressive disorder with anxiety (HCC) -Stable -Will d/c Lexapro 10 mg 2/2 causing hair thinning. -Patient to continue OTC stress/anxiety medication. -Continue counseling with Suzan Garibaldi, LCSW -Continue to monitor for worsening symptoms.  Given precautions.  Follow-up.  Follow Up Instructions:  DIAGMED@   99441 5-10 [id number] 11-20 9443 21-30 I did not refer this patient for an OV in the next 24 hours for this/these  issue(s).  I discussed the assessment and treatment plan with the patient. The patient was provided an opportunity to ask questions and all were answered. The patient agreed with the plan and demonstrated an understanding of the instructions.   The patient was advised to call back or seek an in-person evaluation if the symptoms worsen or if the condition fails to improve as anticipated.  I provided 6 minutes of non-face-to-face time during this encounter.   Deeann Saint, MD

## 2021-06-27 ENCOUNTER — Ambulatory Visit (INDEPENDENT_AMBULATORY_CARE_PROVIDER_SITE_OTHER): Payer: Self-pay | Admitting: Clinical

## 2021-06-27 ENCOUNTER — Other Ambulatory Visit: Payer: Self-pay

## 2021-06-27 DIAGNOSIS — F419 Anxiety disorder, unspecified: Secondary | ICD-10-CM

## 2021-06-27 DIAGNOSIS — F331 Major depressive disorder, recurrent, moderate: Secondary | ICD-10-CM

## 2021-06-27 NOTE — Progress Notes (Signed)
Virtual Visit via Telephone Note   I connected with Sonia Gibson on 06/27/21 at  1:00 PM EST by telephone and verified that I am speaking with the correct person using two identifiers.   Location: Patient: Home Provider: Office   I discussed the limitations, risks, security and privacy concerns of performing an evaluation and management service by telephone and the availability of in person appointments. I also discussed with the patient that there may be a patient responsible charge related to this service. The patient expressed understanding and agreed to proceed.   THERAPIST PROGRESS NOTE   Session Time: 1:00 PM-1:30PM   Participation Level: Active   Behavioral Response: CasualAlertDepressed   Type of Therapy: Individual Therapy   Treatment Goals addressed: Coping   Interventions: CBT, DBT, Solution Focused, Strength-based and Supportive   Summary: Sonia Gibson is a 31 y.o. female who presents with Depression and Anxiety. The OPT therapist worked with the patient for her OPT treatment. The OPT therapist utilized Motivational Interviewing to assist in creating therapeutic repore. The patient in the session was engaged and work in collaboration with the OPT therapist overviewing . The OPT therapist utilized Cognitive Behavioral Therapy through cognitive restructuring as well as worked with the patient on coping strategies to assist in management of mood including ongoing motivation and empowerment for the patient to be as active. The patient spoke about implementing physical exercise and improving her hygenie since the prior OPT session. The OPT therapist continued to work with the patient to implement mood building tools. The OPT therapist continued to support and encourage the patient utilize her coping including reading bible, journaling, and praying.     Suicidal/Homicidal: Nowithout intent/plan   Therapist Response: The OPT therapist worked with the patient for the patients  scheduled session. The patient was engaged in her session and gave feedback in relation to triggers, symptoms, and behavior responses over the past few weeks The patient has been working to actively implement tools to improve her basic health need areas around sleep, eating habits, hygiene, and physical exercise.The OPT therapist worked with the patient utilizing an in session Cognitive Behavioral Therapy exercise. The patient was responsive in the session and verbalized, " I am going to work implement and plan for self mood boosting". The patient spoke about the stressor working with her child who is currently under the weather.The OPT therapist will continue treatment work with the patient in her next scheduled session.      Plan: Return again in 2 weeks.   Diagnosis:      Axis I: Major depressive disorder, recurrent episode, moderate with anxious distress                            Axis II: No diagnosis   I discussed the assessment and treatment plan with the patient. The patient was provided an opportunity to ask questions and all were answered. The patient agreed with the plan and demonstrated an understanding of the instructions.   The patient was advised to call back or seek an in-person evaluation if the symptoms worsen or if the condition fails to improve as anticipated.   I provided 30 minutes of non-face-to-face time during this encounter.   Winfred Burn, LCSW   06/27/2021

## 2021-07-02 ENCOUNTER — Other Ambulatory Visit (HOSPITAL_COMMUNITY): Payer: Self-pay

## 2021-07-05 ENCOUNTER — Telehealth: Payer: No Typology Code available for payment source | Admitting: Nurse Practitioner

## 2021-07-05 DIAGNOSIS — N760 Acute vaginitis: Secondary | ICD-10-CM

## 2021-07-05 MED ORDER — METRONIDAZOLE 0.75 % VA GEL: 1.0000 | Freq: Two times a day (BID) | VAGINAL | 0 refills | Status: DC

## 2021-07-05 NOTE — Progress Notes (Signed)
Virtual Visit Consent   [name], you are scheduled for a virtual visit with a [facility] provider today.     Just as with appointments in the office, your consent must be obtained to participate.  Your consent will be active for this visit and any virtual visit you may have with one of our providers in the next 365 days.     If you have a MyChart account, a copy of this consent can be sent to you electronically.  All virtual visits are billed to your insurance company just like a traditional visit in the office.    As this is a virtual visit, video technology does not allow for your provider to perform a traditional examination.  This may limit your provider's ability to fully assess your condition.  If your provider identifies any concerns that need to be evaluated in person or the need to arrange testing (such as labs, EKG, etc.), we will make arrangements to do so.     Although advances in technology are sophisticated, we cannot ensure that it will always work on either your end or our end.  If the connection with a video visit is poor, the visit may have to be switched to a telephone visit.  With either a video or telephone visit, we are not always able to ensure that we have a secure connection.     I need to obtain your verbal consent now.   Are you willing to proceed with your visit today?    Makynna Bolland has provided verbal consent on 07/05/2021 for a virtual visit (video or telephone).   Gildardo Pounds, NP   Date: 07/05/2021 10:40 AM   Virtual Visit via Video Note   I, Gildardo Pounds, connected with  Sonia Gibson  ([id number], 12-Mar-1991) on 07/05/21 at 10:30 AM EST by a video-enabled telemedicine application and verified that I am speaking with the correct person using two identifiers.  Location: Patient: Virtual Visit Location Patient: Home Provider: Virtual Visit Location Provider: Home Office   I discussed the limitations of evaluation and management by  telemedicine and the availability of in person appointments. The patient expressed understanding and agreed to proceed.    History of Present Illness: Sonia Gibson is a 31 y.o. who identifies as a female who was assigned female at birth, and is being seen today for recurring bacterial vaginitis  States after her menstrual cycles she gets recurring BV symptoms. Usually treated for this with metronidazole gel and is requesting a refill of this medication today.  Notes malodorous vaginal discharge.    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 Insert 1 Applicatorful of medication into the vagina once nightly x 7 nights, Disp: 40 g, Rfl: 0   metroNIDAZOLE (METROGEL) 0.75 % vaginal gel, Place 1 Applicatorful vaginally 2 (two) times daily., Disp: 70 g, Rfl: 0   Norgestimate-Ethinyl Estradiol Triphasic (ORTHO TRI-CYCLEN LO) 0.18/0.215/0.25 MG-25 MCG tab, Take 1 tablet by mouth daily., Disp: 28 tablet, Rfl: 11   valACYclovir (VALTREX) 1000 MG tablet, Take 1 tablet (1,000 mg total) by  mouth daily., Disp: 90 tablet, Rfl: 3  Observations/Objective: Patient is well-developed, well-nourished in no acute distress.  Resting comfortably at home.  Head is normocephalic, atraumatic.  No labored breathing.  Speech is clear and coherent with logical content.  Patient is alert and oriented at baseline.    Assessment and Plan: 1. Recurrent vaginitis - metroNIDAZOLE (METROGEL) 0.75 % vaginal gel; Place 1 Applicatorful vaginally 2 (two) times daily.  Dispense: 70 g; Refill: 0   Follow Up  Instructions: I discussed the assessment and treatment plan with the patient. The patient was provided an opportunity to ask questions and all were answered. The patient agreed with the plan and demonstrated an understanding of the instructions.  A copy of instructions were sent to the patient via MyChart unless otherwise noted below.     The patient was advised to call back or seek an in-person evaluation if the symptoms worsen or if the condition fails to improve as anticipated.  Time:  I spent 11 minutes with the patient via telehealth technology discussing the above problems/concerns.    Gildardo Pounds, NP

## 2021-07-05 NOTE — Patient Instructions (Signed)
°  Sonia Gibson, thank you for joining Gildardo Pounds, NP for today's virtual visit.  While this provider is not your primary care provider (PCP), if your PCP is located in our provider database this encounter information will be shared with them immediately following your visit.  Consent: (Patient) Sonia Gibson provided verbal consent for this virtual visit at the beginning of the encounter.  Current Medications:  Current Outpatient Medications:    cetirizine (ZYRTEC) 10 MG tablet, Take 1 tablet (10 mg total) by mouth daily., Disp: 30 tablet, Rfl: 11   clindamycin (CLEOCIN) 2 % vaginal cream, Place 1 Applicatorful vaginally at bedtime. Insert 1 Applicatorful of medication into the vagina once nightly x 7 nights, Disp: 40 g, Rfl: 0   metroNIDAZOLE (METROGEL) 0.75 % vaginal gel, Place 1 Applicatorful vaginally 2 (two) times daily., Disp: 70 g, Rfl: 0   Norgestimate-Ethinyl Estradiol Triphasic (ORTHO TRI-CYCLEN LO) 0.18/0.215/0.25 MG-25 MCG tab, Take 1 tablet by mouth daily., Disp: 28 tablet, Rfl: 11   valACYclovir (VALTREX) 1000 MG tablet, Take 1 tablet (1,000 mg total) by mouth daily., Disp: 90 tablet, Rfl: 3   Medications ordered in this encounter:  Meds ordered this encounter  Medications   metroNIDAZOLE (METROGEL) 0.75 % vaginal gel    Sig: Place 1 Applicatorful vaginally 2 (two) times daily.    Dispense:  70 g    Refill:  0    Order Specific Question:   Supervising Provider    Answer:   Sabra Heck, [place]     *If you need refills on other medications prior to your next appointment, please contact your pharmacy*  Follow-Up: Call back or seek an in-person evaluation if the symptoms worsen or if the condition fails to improve as anticipated.  Other Instructions Follow up with PCP for additional refills   If you have been instructed to have an in-person evaluation today at a local Urgent Care facility, please use the link below. It will take you to a list of all of our  available [facility], including address, phone number and hours of operation. Please do not delay care.  [facility]  If you or a family member do not have a primary care provider, use the link below to schedule a visit and establish care. When you choose a [facility] primary care physician or advanced practice provider, you gain a long-term partner in health. Find a Primary Care Provider  Learn more about 's in-office and virtual care options: [facility]

## 2021-07-21 ENCOUNTER — Other Ambulatory Visit: Payer: Self-pay

## 2021-07-21 ENCOUNTER — Ambulatory Visit (INDEPENDENT_AMBULATORY_CARE_PROVIDER_SITE_OTHER): Payer: Self-pay | Admitting: Clinical

## 2021-07-21 DIAGNOSIS — F419 Anxiety disorder, unspecified: Secondary | ICD-10-CM

## 2021-07-21 DIAGNOSIS — F331 Major depressive disorder, recurrent, moderate: Secondary | ICD-10-CM

## 2021-07-21 NOTE — Plan of Care (Signed)
Verbal Consent 

## 2021-07-21 NOTE — Progress Notes (Signed)
Virtual Visit via Telephone Note ?  ?I connected with Sonia Gibson on 07/21/21 at  1:00 PM EST by telephone and verified that I am speaking with the correct person using two identifiers. ?  ?Location: ?Patient: Home ?Provider: Office ?  ?I discussed the limitations, risks, security and privacy concerns of performing an evaluation and management service by telephone and the availability of in person appointments. I also discussed with the patient that there may be a patient responsible charge related to this service. The patient expressed understanding and agreed to proceed. ?  ?THERAPIST PROGRESS NOTE ?  ?Session Time: 1:00 PM-1:45 PM ?  ?Participation Level: Active ?  ?Behavioral Response: CasualAlertDepressed ?  ?Type of Therapy: Individual Therapy ?  ?Treatment Goals addressed: Coping ?  ?Interventions: CBT, DBT, Solution Focused, Strength-based and Supportive ?  ?Summary: Sonia Gibson is a 31 y.o. female who presents with Depression and Anxiety. The OPT therapist worked with the patient for her OPT treatment. The OPT therapist utilized Motivational Interviewing to assist in creating therapeutic repore. The patient in the session was engaged and work in collaboration with the OPT therapist overviewing  the impact of work stress on her mental health. The OPT therapist utilized Cognitive Behavioral Therapy through cognitive restructuring as well as worked with the patient on coping strategies to assist in management of mood including ongoing motivation and empowerment for the patient to be as active. The patient spoke about implementing physical exercise and improving her hygenie since the prior OPT session. The OPT therapist continued to work with the patient to implement mood building tools. The OPT therapist continued to support and encourage the patient utilize her coping including reading bible, journaling, and praying. The OPT therapist worked with the patient on ideas to mitigate her work stress outside  of changing her job and having that be the last option once she felt she has done everything she can to make it work. ?  ?Suicidal/Homicidal: Nowithout intent/plan ?  ?Therapist Response: The OPT therapist worked with the patient for the patients scheduled session. The patient was engaged in her session and gave feedback in relation to triggers, symptoms, and behavior responses over the past few weeks The patient has been working to actively implement tools to improve her basic health need areas around sleep, eating habits, hygiene, and physical exercise.The OPT therapist worked with the patient utilizing an in session Cognitive Behavioral Therapy exercise. 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 The patient spoke with the OPT therapist and worked looking at ideas to make her work stress more manageable including reviewing with her supervisor some of her current concerns.The OPT therapist will continue treatment work with the patient in her next scheduled session. ?   ?  ?Plan: Return again in 2 weeks. ?  ?Diagnosis:      Axis I: Major depressive disorder, recurrent episode, moderate with anxious distress  ?  ?                        Axis II: No diagnosis ? ?Collaboration of Care: No additional collaboration of care for this session. ?  ?Patient/Guardian was advised Release of Information must be obtained prior to any record release in order to collaborate their care with an outside provider. Patient/Guardian was advised if they have not already  done so to contact the registration department to sign all necessary forms in order for [place] to release information regarding their care.  ?  ?Consent: Patient/Guardian gives verbal consent for treatment and assignment of benefits for services provided during this visit. Patient/Guardian expressed understanding and agreed to  proceed.  ? ?  ?I discussed the assessment and treatment plan with the patient. The patient was provided an opportunity to ask questions and all were answered. The patient agreed with the plan and demonstrated an understanding of the instructions. ?  ?The patient was advised to call back or seek an in-person evaluation if the symptoms worsen or if the condition fails to improve as anticipated. ?  ?I provided 45 minutes of non-face-to-face time during this encounter. ?  ?Lennox Grumbles, LCSW ?  ?07/21/2021 ?

## 2021-08-04 ENCOUNTER — Encounter: Payer: Self-pay | Admitting: Family Medicine

## 2021-08-04 ENCOUNTER — Ambulatory Visit (INDEPENDENT_AMBULATORY_CARE_PROVIDER_SITE_OTHER): Payer: No Typology Code available for payment source | Admitting: Family Medicine

## 2021-08-04 VITALS — BP 130/91 | HR 71 | Temp 97.6°F | Wt 114.4 lb

## 2021-08-04 DIAGNOSIS — Z113 Encounter for screening for infections with a predominantly sexual mode of transmission: Secondary | ICD-10-CM

## 2021-08-04 DIAGNOSIS — F331 Major depressive disorder, recurrent, moderate: Secondary | ICD-10-CM

## 2021-08-04 DIAGNOSIS — F419 Anxiety disorder, unspecified: Secondary | ICD-10-CM | POA: Diagnosis not present

## 2021-08-04 NOTE — Progress Notes (Signed)
Subjective:  ? ? Patient ID: Sonia Gibson, female    DOB: [birth year], 31 y.o.   MRN: [id number] ? ?Chief Complaint  ?Patient presents with  ? Exposure to STD  ? Depression  ?  FMLA continuance, has been seeing counselor, will continue visits.  ? ? ?HPI ?Patient was seen today for follow-up.  Patient inquires about STI testing.  Denies current symptoms.  Patient also inquires about renewing FMLA forms for anxiety/depression.  Pt in counseling q 2 wks with Suzan Garibaldi, LCSW.  Tries to open the blinds each day to get sunlight.  Encouraged to keep up hygiene and cleaning around the house.  Pt notes continued stress at work.  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  Pt states she can be short tempered with others including her son due to the stress from work.  Pt taking OTC anti anxiety/stress meds and ashwaganda.  Pt noticed hair thinning at edges.  One beautician said it was 2/2 stress, another said take vitamins. ? ?Past Medical History:  ?Diagnosis Date  ? BV (bacterial vaginosis) 05/03/2014  ? Medical history non-contributory   ? Vaginal discharge 05/03/2014  ? ? ?No Known Allergies ? ?ROS ?General: Denies fever, chills, night sweats, changes in weight, changes in appetite ?HEENT: Denies headaches, ear pain, changes in vision, rhinorrhea, sore throat + hair thinning ?CV: Denies CP, palpitations, SOB, orthopnea ?Pulm: Denies SOB, cough, wheezing ?GI: Denies abdominal pain, nausea, vomiting, diarrhea, constipation ?GU: Denies dysuria, hematuria, frequency, vaginal discharge ?Msk: Denies muscle cramps, joint pains ?Neuro: Denies weakness, numbness, tingling ?Skin: Denies rashes, bruising ?Psych: Denies depression, anxiety, hallucinations  +anxiety and depression ? ?   ?Objective:  ?  ?Blood pressure (!) 130/91, pulse 71, temperature 97.6 ?F (36.4 ?C), temperature source Oral, weight 114 lb 6.4 oz (51.9 kg), SpO2 100 %. ? ?Gen. Pleasant, well-nourished, in no distress, normal affect   ?HEENT: Hair in  box braids, no irritation of edges noted, scalp clean.  [place]/AT, face symmetric, conjunctiva clear, no scleral icterus, PERRLA, EOMI, nares patent without drainage ?Lungs: no accessory muscle use ?Cardiovascular: RRR, no peripheral edema ?Musculoskeletal: No deformities, no cyanosis or clubbing, normal tone ?Neuro:  A&Ox3, CN II-XII intact, normal gait ?Skin:  Warm, no lesions/ rash ? ? ?Wt Readings from Last 3 Encounters:  ?08/04/21 114 lb 6.4 oz (51.9 kg)  ?06/13/21 112 lb (50.8 kg)  ?01/30/21 109 lb (49.4 kg)  ? ? ?Lab Results  ?Component Value Date  ? WBC 4.1 10/02/2020  ? HGB 13.7 10/02/2020  ? HCT 40.2 10/02/2020  ? PLT 211.0 10/02/2020  ? GLUCOSE 76 10/02/2020  ? CHOL 145 10/02/2020  ? TRIG 57.0 10/02/2020  ? HDL 47.70 10/02/2020  ? LDLCALC 86 10/02/2020  ? ALT 7 04/15/2016  ? AST 14 04/15/2016  ? NA 141 10/02/2020  ? K 3.8 10/02/2020  ? CL 105 10/02/2020  ? CREATININE 0.69 10/02/2020  ? BUN 7 10/02/2020  ? CO2 28 10/02/2020  ? TSH 1.81 10/02/2020  ? HGBA1C 5.3 10/02/2020  ? ?Depression screen [facility] 2/9 08/04/2021 03/18/2021 01/30/2021  ?Decreased Interest 2 3 2   ?Down, Depressed, Hopeless 2 3 3   ?PHQ - 2 Score 4 6 5   ?Altered sleeping 3 3 3   ?Tired, decreased energy 3 3 2   ?Change in appetite 2 2 2   ?Feeling bad or failure about yourself  3 1 2   ?Trouble concentrating 2 3 2   ?Moving slowly or fidgety/restless 2 0 2  ?Suicidal thoughts 0 0 0  ?  PHQ-9 Score 19 18 18   ?Difficult doing work/chores Very difficult Somewhat difficult Somewhat difficult  ? ?GAD 7 : Generalized Anxiety Score 08/04/2021 03/18/2021 01/30/2021 10/02/2020  ?Nervous, Anxious, on Edge 2 3 3 3   ?Control/stop worrying 3 3 3 3   ?Worry too much - different things 3 3 3 3   ?Trouble relaxing 2 3 2 2   ?Restless 3 3 2 2   ?Easily annoyed or irritable 3 3 3 3   ?Afraid - awful might happen 2 1 1 3   ?Total GAD 7 Score 18 19 17 19   ?Anxiety Difficulty Very difficult Very difficult Very difficult -  ? ? ? ?Assessment/Plan: ? ?Routine screening for STI (sexually  transmitted infection)  ?- Plan: RPR, HIV Antibody (routine testing w rflx), C. trachomatis/N. gonorrhoeae RNA ? ?Recurrent moderate major depressive disorder with anxiety (HCC) ?-PHQ 9 score 19 ?-GAD 7 score 18 ?-continue counseling ?-consider rx medication given continued symptoms ?-will complete FMLA form. ? ?F/u in 2-3 months, sooner if needed. ? ?10/04/2020, MD ?

## 2021-08-05 LAB — RPR: RPR Ser Ql: NONREACTIVE

## 2021-08-05 LAB — C. TRACHOMATIS/N. GONORRHOEAE RNA
C. trachomatis RNA, TMA: NOT DETECTED
N. gonorrhoeae RNA, TMA: NOT DETECTED

## 2021-08-05 LAB — HIV ANTIBODY (ROUTINE TESTING W REFLEX): HIV 1&2 Ab, 4th Generation: NONREACTIVE

## 2021-08-07 ENCOUNTER — Encounter: Payer: Self-pay | Admitting: Family Medicine

## 2021-08-18 ENCOUNTER — Ambulatory Visit (INDEPENDENT_AMBULATORY_CARE_PROVIDER_SITE_OTHER): Payer: Self-pay | Admitting: Clinical

## 2021-08-18 DIAGNOSIS — F419 Anxiety disorder, unspecified: Secondary | ICD-10-CM

## 2021-08-18 DIAGNOSIS — F331 Major depressive disorder, recurrent, moderate: Secondary | ICD-10-CM

## 2021-08-18 NOTE — Progress Notes (Signed)
Virtual Visit via Telephone Note ?  ?I connected with Sonia Gibson on 08/18/21 at  3:00 PM EST by telephone and verified that I am speaking with the correct person using two identifiers. ?  ?Location: ?Patient: Home ?Provider: Office ?  ?I discussed the limitations, risks, security and privacy concerns of performing an evaluation and management service by telephone and the availability of in person appointments. I also discussed with the patient that there may be a patient responsible charge related to this service. The patient expressed understanding and agreed to proceed. ?  ?THERAPIST PROGRESS NOTE ?  ?Session Time: 3:00 PM-3:30 PM ?  ?Participation Level: Active ?  ?Behavioral Response: CasualAlertDepressed ?  ?Type of Therapy: Individual Therapy ?  ?Treatment Goals addressed: Coping ?  ?Interventions: CBT, DBT, Solution Focused, Strength-based and Supportive ?  ?Summary: Sonia Gibson is a 31 y.o. female who presents with Depression and Anxiety. The OPT therapist worked with the patient for her OPT treatment. The OPT therapist utilized Motivational Interviewing to assist in creating therapeutic repore. The patient in the session was engaged and work in collaboration with the OPT therapist overviewing  the impact of work stress on her mental health. The OPT therapist utilized Cognitive Behavioral Therapy through cognitive restructuring as well as worked with the patient on coping strategies to assist in management of mood including ongoing motivation and empowerment for the patient to be as active. The OPT therapist continued to support and encourage the patient utilize her coping including reading bible, journaling, and praying. The OPT therapist worked with the patient who spoke about sticking with her job but currently still looking for other opportunities.The patient spoke about taking time for herself going to a concert in [place] over the past weekend. ?  ?Suicidal/Homicidal: Nowithout intent/plan ?   ?Therapist Response: The OPT therapist worked with the patient for the patients scheduled session. The patient was engaged in her session and gave feedback in relation to triggers, symptoms, and behavior responses over the past few weeks The patient has been working to actively implement tools to improve her basic health need areas around sleep, eating habits, hygiene, and physical exercise.The OPT therapist worked with the patient utilizing an in session Cognitive Behavioral Therapy exercise. The patient was responsive in the session and verbalized, " Things have been better since we talked last I enjoyed going to a concert I won tickets for over the past weekend". The patient spoke with the OPT therapist noting she did speak to her supervisor concerning a previous work related situation.The patient is currently still working for the same employer, but keeping her options open. The patient spoke about looking forward to her upcoming Birthday , but noted she does not have any solid plans yet. The patient spoke about the upcoming Spring Break for her kids school next week.The OPT therapist will continue treatment work with the patient in her next scheduled session. ?   ?  ?Plan: Return again in 2 weeks. ?  ?Diagnosis:      Axis I: Major depressive disorder, recurrent episode, moderate with anxious distress  ?  ?                        Axis II: No diagnosis ?  ?Collaboration of Care: No additional collaboration of care for this session. ?  ?Patient/Guardian was advised Release of Information must be obtained prior to any record release in order to collaborate their care with an outside provider. Patient/Guardian was advised  if they have not already done so to contact the registration department to sign all necessary forms in order for [place] to release information regarding their care.  ?  ?Consent: Patient/Guardian gives verbal consent for treatment and assignment of benefits for services provided during this visit.  Patient/Guardian expressed understanding and agreed to proceed.  ?  ?  ?I discussed the assessment and treatment plan with the patient. The patient was provided an opportunity to ask questions and all were answered. The patient agreed with the plan and demonstrated an understanding of the instructions. ?  ?The patient was advised to call back or seek an in-person evaluation if the symptoms worsen or if the condition fails to improve as anticipated. ?  ?I provided 30 minutes of non-face-to-face time during this encounter. ?  ?Winfred Burn, LCSW ?  ?08/18/2021 ?

## 2021-08-18 NOTE — Plan of Care (Signed)
Verbal Consent 

## 2021-08-25 ENCOUNTER — Telehealth (INDEPENDENT_AMBULATORY_CARE_PROVIDER_SITE_OTHER): Payer: No Typology Code available for payment source | Admitting: Family Medicine

## 2021-08-25 ENCOUNTER — Encounter: Payer: Self-pay | Admitting: Family Medicine

## 2021-08-25 ENCOUNTER — Other Ambulatory Visit (HOSPITAL_COMMUNITY): Payer: Self-pay

## 2021-08-25 VITALS — Ht 64.25 in | Wt 114.4 lb

## 2021-08-25 DIAGNOSIS — Z029 Encounter for administrative examinations, unspecified: Secondary | ICD-10-CM | POA: Diagnosis not present

## 2021-08-25 NOTE — Progress Notes (Signed)
?  Visit scheduled as a video visit, however pt did not have her video on during the visit. ? ?Virtual Visit via Telephone Note ? ?I connected with Sonia Gibson on 08/25/21 at  4:45 PM EDT by telephone and verified that I am speaking with the correct person using two identifiers. ?  ?I discussed the limitations, risks, security and privacy concerns of performing an evaluation and management service by telephone and the availability of in person appointments. I also discussed with the patient that there may be a patient responsible charge related to this service. The patient expressed understanding and agreed to proceed. ? ?Location patient: home ?Location provider: work or home office ?Participants present for the call: patient, provider ?Patient did not have a visit in the prior 7 days to address this/these issue(s). ? ? ?History of Present Illness: ?Patient inquires if/when FMLA forms were sent.  Pt requesting FMLA paperwork to be updated stating form was filled out incorrectly.  Requesting questions 7 duration of office visits to be increased from 1.5 hours to 2 hours.  Patient inquires if a new form was sent from [facility].  States if the form was not sent back okay to resend original form with updated information.   ? ?Observations/Objective: ?Patient sounds cheerful and well on the phone. ?I do not appreciate any SOB. ?Speech and thought processing are grossly intact. ?Patient reported vitals: ? ?Assessment and Plan: ?Administrative encounter ?-FMLA form initially completed and sent to [employer] 08/08/21.  Form completed the same as previous ?Would not increase in days per month pt could be out, from 1 to 2. ?-Patient advised duration of appointments, 1.5 hours was the same from last year. ?-We will check with my office to see if a new form or the old form was sent back to the office. ?-Though in counseling would recommend patient follow up with Psychiatry, given continued symptoms requiring FMLA. ? ?Follow Up  Instructions: ? ?99441 5-10 ?99442 11-20 ?9443 21-30 ?I did not refer this patient for an OV in the next 24 hours for this/these issue(s). ? ?I discussed the assessment and treatment plan with the patient. The patient was provided an opportunity to ask questions and all were answered. The patient agreed with the plan and demonstrated an understanding of the instructions. ?  ?The patient was advised to call back or seek an in-person evaluation if the symptoms worsen or if the condition fails to improve as anticipated. ? ?I provided 5 minutes of non-face-to-face time during this encounter. ? ? ?Deeann Saint, MD  ? ? ?

## 2021-09-23 ENCOUNTER — Ambulatory Visit (INDEPENDENT_AMBULATORY_CARE_PROVIDER_SITE_OTHER): Payer: Self-pay | Admitting: Clinical

## 2021-09-23 DIAGNOSIS — F331 Major depressive disorder, recurrent, moderate: Secondary | ICD-10-CM

## 2021-09-23 DIAGNOSIS — F419 Anxiety disorder, unspecified: Secondary | ICD-10-CM

## 2021-09-23 NOTE — Progress Notes (Signed)
Virtual Visit via Telephone Note ?  ?I connected with Sonia Gibson on 09/23/21 at  3:00 PM EST by telephone and verified that I am speaking with the correct person using two identifiers. ?  ?Location: ?Patient: Home ?Provider: Office ?  ?I discussed the limitations, risks, security and privacy concerns of performing an evaluation and management service by telephone and the availability of in person appointments. I also discussed with the patient that there may be a patient responsible charge related to this service. The patient expressed understanding and agreed to proceed. ?  ?THERAPIST PROGRESS NOTE ?  ?Session Time: 3:00 PM-3:30 PM ?  ?Participation Level: Active ?  ?Behavioral Response: CasualAlertDepressed ?  ?Type of Therapy: Individual Therapy ?  ?Treatment Goals addressed: Coping ?  ?Interventions: CBT, DBT, Solution Focused, Strength-based and Supportive ?  ?Summary: Sonia Gibson is a 31 y.o. female who presents with Depression and Anxiety. The OPT therapist worked with the patient for her OPT treatment. The OPT therapist utilized Motivational Interviewing to assist in creating therapeutic repore. The patient in the session was engaged and work in collaboration with the OPT therapist overviewing functioning and family interaction. The OPT therapist utilized Cognitive Behavioral Therapy through cognitive restructuring as well as worked with the patient on coping strategies to assist in management of mood including ongoing motivation and empowerment for the patient to be as active. The OPT therapist continued to support and encourage the patient utilize her coping including reading bible, journaling, and praying.The patient spoke about taking time for herself going to a concert in [place] over the past weekend. ?  ?Suicidal/Homicidal: Nowithout intent/plan ?  ?Therapist Response: The OPT therapist worked with the patient for the patients scheduled session. The patient was engaged in her session and gave  feedback in relation to triggers, symptoms, and behavior responses over the past few weeks The patient has been working to actively implement tools to improve her basic health need areas around sleep, eating habits, hygiene, and physical exercise.The OPT therapist worked with the patient utilizing an in session Cognitive Behavioral Therapy exercise. The patient was responsive in the session and verbalized, " My aunt in coming in and I am hoping to go to dinner with my aunt and my Mom". 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  The patient noted she did enjoy my concert trip with my cousin and I am realizing I need to continue to work to balance my stressors. The patient noted that her sons father who has been incarcerated is being released next month. The patient noted that she is still in the process of getting full legal custody of her son.The OPT therapist will continue treatment work with the patient in her next scheduled session. ?   ?  ?Plan: Return again in 2 weeks. ?  ?Diagnosis:      Axis I: Major depressive disorder, recurrent episode, moderate with anxious distress  ?  ?                        Axis II: No diagnosis ?  ?Collaboration of Care: No additional collaboration of care for this session. ?  ?Patient/Guardian was advised Release of Information must be obtained prior to any record release in order to collaborate their care  with an outside provider. Patient/Guardian was advised if they have not already done so to contact the registration department to sign all necessary forms in order for [place] to release information regarding their care.  ?  ?Consent: Patient/Guardian gives verbal consent for treatment and assignment of benefits for services provided during this visit. Patient/Guardian expressed understanding  and agreed to proceed.  ?  ?  ?I discussed the assessment and treatment plan with the patient. The patient was provided an opportunity to ask questions and all were answered. The patient agreed with the plan and demonstrated an understanding of the instructions. ?  ?The patient was advised to call back or seek an in-person evaluation if the symptoms worsen or if the condition fails to improve as anticipated. ?  ?I provided 30 minutes of non-face-to-face time during this encounter. ?  ?Winfred Burn, LCSW ?  ?09/23/2021 ?

## 2021-10-05 ENCOUNTER — Telehealth: Payer: No Typology Code available for payment source | Admitting: Physician Assistant

## 2021-10-05 DIAGNOSIS — N760 Acute vaginitis: Secondary | ICD-10-CM | POA: Diagnosis not present

## 2021-10-05 MED ORDER — METRONIDAZOLE 0.75 % VA GEL: 1.0000 | Freq: Two times a day (BID) | VAGINAL | 0 refills | Status: DC

## 2021-10-05 NOTE — Progress Notes (Signed)
Virtual Visit Consent   Sonia Gibson, you are scheduled for a virtual visit with a [facility] provider today. Just as with appointments in the office, your consent must be obtained to participate. Your consent will be active for this visit and any virtual visit you may have with one of our providers in the next 365 days. If you have a MyChart account, a copy of this consent can be sent to you electronically.  As this is a virtual visit, video technology does not allow for your provider to perform a traditional examination. This may limit your provider's ability to fully assess your condition. If your provider identifies any concerns that need to be evaluated in person or the need to arrange testing (such as labs, EKG, etc.), we will make arrangements to do so. Although advances in technology are sophisticated, we cannot ensure that it will always work on either your end or our end. If the connection with a video visit is poor, the visit may have to be switched to a telephone visit. With either a video or telephone visit, we are not always able to ensure that we have a secure connection.  By engaging in this virtual visit, you consent to the provision of healthcare and authorize for your insurance to be billed (if applicable) for the services provided during this visit. Depending on your insurance coverage, you may receive a charge related to this service.  I need to obtain your verbal consent now. Are you willing to proceed with your visit today? Sonia Gibson has provided verbal consent on 10/05/2021 for a virtual visit (video or telephone). Roney Jaffe, PA-C  Date: 10/05/2021 4:13 PM  Virtual Visit via Video Note   I, Sonia Gibson, connected with  Sonia Gibson  ([id number], 06-17-1990) on 10/05/21 at  4:15 PM EDT by a video-enabled telemedicine application and verified that I am speaking with the correct person using two identifiers.  Location: Patient: Virtual Visit Location Patient:  Home Provider: Virtual Visit Location Provider: Home Office   I discussed the limitations of evaluation and management by telemedicine and the availability of in person appointments. The patient expressed understanding and agreed to proceed.    History of Present Illness: Sonia Gibson is a 31 y.o. who identifies as a female who was assigned female at birth, and is being seen today for another episode of BV, states that she gets this during her menses and it is generally resolved with MetroGel.  States that this has been going on "for years".  Denies nausea, vomiting, dysuria, fever, tachycardia, suprapubic discomfort or low back pain.  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 Insert 1 Applicatorful of medication into the vagina once nightly x 7 nights, Disp: 40 g, Rfl: 0   metroNIDAZOLE (METROGEL) 0.75 % vaginal gel, Place 1 Applicatorful vaginally 2 (two) times daily., Disp: 70 g, Rfl: 0   Norgestimate-Ethinyl Estradiol Triphasic (ORTHO TRI-CYCLEN LO) 0.18/0.215/0.25 MG-25 MCG tab, Take 1 tablet by mouth daily., Disp: 28 tablet, Rfl: 11   valACYclovir (VALTREX) 1000 MG tablet, Take 1  tablet (1,000 mg total) by mouth daily., Disp: 90 tablet, Rfl: 3  Observations/Objective: Patient is well-developed, well-nourished in no acute distress.  Resting comfortably  at home.  Head is normocephalic, atraumatic.  No labored breathing.  Speech is clear and coherent with logical content.  Patient is alert and oriented at baseline.    Assessment and Plan: 1. Recurrent  vaginitis - metroNIDAZOLE (METROGEL) 0.75 % vaginal gel; Place 1 Applicatorful vaginally 2 (two) times daily.  Dispense: 70 g; Refill: 0  Patient education given on supportive care, encouraged to follow-up with primary care provider or gynecology for consideration of preventative treatment.  Follow Up Instructions: I discussed the assessment and treatment plan with the patient. The patient was provided an opportunity to ask questions and all were answered. The patient agreed with the plan and demonstrated an understanding of the instructions.  A copy of instructions were sent to the patient via MyChart unless otherwise noted below.     The patient was advised to call back or seek an in-person evaluation if the symptoms worsen or if the condition fails to improve as anticipated.  Time:  I spent 11 minutes with the patient via telehealth technology discussing the above problems/concerns.    Kasandra Knudsen Mayers, PA-C

## 2021-10-05 NOTE — Patient Instructions (Signed)
[name], thank you for joining Kennieth Rad, PA-C for today's virtual visit.  While this provider is not your primary care provider (PCP), if your PCP is located in our provider database this encounter information will be shared with them immediately following your visit.  Consent: (Patient) Sonia Gibson provided verbal consent for this virtual visit at the beginning of the encounter.  Current Medications:  Current Outpatient Medications:    cetirizine (ZYRTEC) 10 MG tablet, Take 1 tablet (10 mg total) by mouth daily., Disp: 30 tablet, Rfl: 11   clindamycin (CLEOCIN) 2 % vaginal cream, Place 1 Applicatorful vaginally at bedtime. Insert 1 Applicatorful of medication into the vagina once nightly x 7 nights, Disp: 40 g, Rfl: 0   metroNIDAZOLE (METROGEL) 0.75 % vaginal gel, Place 1 Applicatorful vaginally 2 (two) times daily., Disp: 70 g, Rfl: 0   Norgestimate-Ethinyl Estradiol Triphasic (ORTHO TRI-CYCLEN LO) 0.18/0.215/0.25 MG-25 MCG tab, Take 1 tablet by mouth daily., Disp: 28 tablet, Rfl: 11   valACYclovir (VALTREX) 1000 MG tablet, Take 1 tablet (1,000 mg total) by mouth daily., Disp: 90 tablet, Rfl: 3   Medications ordered in this encounter:  Meds ordered this encounter  Medications   metroNIDAZOLE (METROGEL) 0.75 % vaginal gel    Sig: Place 1 Applicatorful vaginally 2 (two) times daily.    Dispense:  70 g    Refill:  0    Order Specific Question:   Supervising Provider    Answer:   Sabra Heck, [place]     *If you need refills on other medications prior to your next appointment, please contact your pharmacy*  Follow-Up: Call back or seek an in-person evaluation if the symptoms worsen or if the condition fails to improve as anticipated.  Other Instructions I do encourage you to follow-up with gynecology or your primary care provider to have a discussion to consider treatment for recurrent vaginitis.  I hope that you feel better soon.  Bacterial Vaginosis  Bacterial  vaginosis is an infection that occurs when the normal balance of bacteria in the vagina changes. This change is caused by an overgrowth of certain bacteria in the vagina. Bacterial vaginosis is the most common vaginal infection among females aged 53 to 71 years. This condition increases the risk of sexually transmitted infections (STIs). Treatment can help reduce this risk. Treatment is very important for pregnant women because this condition can cause babies to be born early (prematurely) or at a low birth weight. What are the causes? This condition is caused by an increase in harmful bacteria that are normally present in small amounts in the vagina. However, the exact reason this condition develops is not known. You cannot get bacterial vaginosis from toilet seats, bedding, swimming pools, or contact with objects around you. What increases the risk? The following factors may make you more likely to develop this condition: Having a new sexual partner or multiple sexual partners, or having unprotected sex. Douching. Having an intrauterine device (IUD). Smoking. Abusing drugs and alcohol. This may lead to riskier sexual behavior. Taking certain antibiotic medicines. Being pregnant. What are the signs or symptoms? Some women with this condition have no symptoms. Symptoms may include: Pearline Cables or white vaginal discharge. The discharge can be watery or foamy. A fish-like odor with discharge, especially after sex or during menstruation. Itching in and around the vagina. Burning or pain with urination. How is this diagnosed? This condition is diagnosed based on: Your medical history. A physical exam of the vagina. Checking a sample of  vaginal fluid for harmful bacteria or abnormal cells. How is this treated? This condition is treated with antibiotic medicines. These may be given as a pill, a vaginal cream, or a medicine that is put into the vagina (suppository). If the condition comes back after  treatment, a second round of antibiotics may be needed. Follow these instructions at home: Medicines Take or apply over-the-counter and prescription medicines only as told by your health care provider. Take or apply your antibiotic medicine as told by your health care provider. Do not stop using the antibiotic even if you start to feel better. General instructions If you have a female sexual partner, tell her that you have a vaginal infection. She should follow up with her health care provider. If you have a female sexual partner, he does not need treatment. Avoid sexual activity until you finish treatment. Drink enough fluid to keep your urine pale yellow. Keep the area around your vagina and rectum clean. Wash the area daily with warm water. Wipe yourself from front to back after using the toilet. If you are breastfeeding, talk to your health care provider about continuing breastfeeding during treatment. Keep all follow-up visits. This is important. How is this prevented? Self-care Do not douche. Wash the outside of your vagina with warm water only. Wear cotton or cotton-lined underwear. Avoid wearing tight pants and pantyhose, especially during the summer. Safe sex Use protection when having sex. This includes: Using condoms. Using dental dams. This is a thin layer of a material made of latex or polyurethane that protects the mouth during oral sex. Limit the number of sexual partners. To help prevent bacterial vaginosis, it is best to have sex with just one partner (monogamous relationship). Make sure you and your sexual partner are tested for STIs. Drugs and alcohol Do not use any products that contain nicotine or tobacco. These products include cigarettes, chewing tobacco, and vaping devices, such as e-cigarettes. If you need help quitting, ask your health care provider. Do not use drugs. Do not drink alcohol if: Your health care provider tells you not to do this. You are pregnant,  may be pregnant, or are planning to become pregnant. If you drink alcohol: Limit how much you have to 0-1 drink a day. Be aware of how much alcohol is in your drink. In the [place] one drink equals one 12 oz bottle of beer (355 mL), one 5 oz glass of wine (148 mL), or one 1 oz glass of hard liquor (44 mL). Where to find more information Centers for Disease Control and Prevention: http://www.wolf.info/ American Sexual Health Association (ASHA): www.ashastd.org [place]. Department of Health and Financial controller, Office on Women's Health: [place] Contact a health care provider if: Your symptoms do not improve, even after treatment. You have more discharge or pain when urinating. You have a fever or chills. You have pain in your abdomen or pelvis. You have pain during sex. You have vaginal bleeding between menstrual periods. Summary Bacterial vaginosis is a vaginal infection that occurs when the normal balance of bacteria in the vagina changes. It results from an overgrowth of certain bacteria. This condition increases the risk of sexually transmitted infections (STIs). Getting treated can help reduce this risk. Treatment is very important for pregnant women because this condition can cause babies to be born early (prematurely) or at low birth weight. This condition is treated with antibiotic medicines. These may be given as a pill, a vaginal cream, or a medicine that is put into the vagina (suppository).  This information is not intended to replace advice given to you by your health care provider. Make sure you discuss any questions you have with your health care provider. Document Revised: 11/02/2019 Document Reviewed: 11/02/2019 Elsevier Patient Education  [employer].    If you have been instructed to have an in-person evaluation today at a local Urgent Care facility, please use the link below. It will take you to a list of all of our available [facility], including address,  phone number and hours of operation. Please do not delay care.  [facility]  If you or a family member do not have a primary care provider, use the link below to schedule a visit and establish care. When you choose a [facility] primary care physician or advanced practice provider, you gain a long-term partner in health. Find a Primary Care Provider  Learn more about 's in-office and virtual care options: [facility]

## 2021-10-27 ENCOUNTER — Ambulatory Visit (INDEPENDENT_AMBULATORY_CARE_PROVIDER_SITE_OTHER): Payer: Self-pay | Admitting: Clinical

## 2021-10-27 DIAGNOSIS — F331 Major depressive disorder, recurrent, moderate: Secondary | ICD-10-CM

## 2021-10-27 DIAGNOSIS — F419 Anxiety disorder, unspecified: Secondary | ICD-10-CM

## 2021-10-27 NOTE — Plan of Care (Signed)
Verbal Consent 

## 2021-10-27 NOTE — Progress Notes (Signed)
Virtual Visit via Telephone Note   I connected with Sonia Gibson on 10/27/21 at  3:00 PM EST by telephone and verified that I am speaking with the correct person using two identifiers.   Location: Patient: Home Provider: Office   I discussed the limitations, risks, security and privacy concerns of performing an evaluation and management service by telephone and the availability of in person appointments. I also discussed with the patient that there may be a patient responsible charge related to this service. The patient expressed understanding and agreed to proceed.   THERAPIST PROGRESS NOTE   Session Time: 3:00 PM-3:30 PM   Participation Level: Active   Behavioral Response: CasualAlertDepressed   Type of Therapy: Individual Therapy   Treatment Goals addressed: Coping   Interventions: CBT, DBT, Solution Focused, Strength-based and Supportive   Summary: Sonia Gibson is a 31 y.o. female who presents with Depression and Anxiety. The OPT therapist worked with the patient for her OPT treatment. The OPT therapist utilized Motivational Interviewing to assist in creating therapeutic repore. The patient in the session was engaged and work in collaboration with the OPT therapist overviewing functioning and family interaction. The OPT therapist utilized Cognitive Behavioral Therapy through cognitive restructuring as well as worked with the patient on coping strategies to assist in management of mood including ongoing motivation and empowerment for the patient to be as active. The OPT therapist continued to support and encourage the patient utilize her coping including reading bible, journaling, and praying.The patient spoke about taking time for herself going to [employer] with her son and friends.   Suicidal/Homicidal: Nowithout intent/plan   Therapist Response: The OPT therapist worked with the patient for the patients scheduled session. The patient was engaged in her session and gave  feedback in relation to triggers, symptoms, and behavior responses over the past few weeks The patient has been working to actively implement tools to improve her basic health need areas around sleep, eating habits, hygiene, and physical exercise.The OPT therapist worked with the patient utilizing an in session Cognitive Behavioral Therapy exercise. The patient was responsive in the session and verbalized, " I did go to court and we are working on a custody plan for mediation so we will not have to go back to court". The patient spoke with the OPT therapist about her work life. The patient noted that she is still in the process of getting full legal custody of her son and in mediation with the Father of her child who is now no longer incarcerated..The OPT therapist will continue treatment work with the patient in her next scheduled session.      Plan: Return again in 2 weeks.   Diagnosis:      Axis I: Major depressive disorder, recurrent episode, moderate with anxious distress                            Axis II: No diagnosis   Collaboration of Care: No additional collaboration of care for this session.   Patient/Guardian was advised Release of Information must be obtained prior to any record release in order to collaborate their care with an outside provider. Patient/Guardian was advised if they have not already done so to contact the registration department to sign all necessary forms in order for [place] to release information regarding their care.    Consent: Patient/Guardian gives verbal consent for treatment and assignment of benefits for services provided during this visit. Patient/Guardian expressed understanding  and agreed to proceed.      I discussed the assessment and treatment plan with the patient. The patient was provided an opportunity to ask questions and all were answered. The patient agreed with the plan and demonstrated an understanding of the instructions.   The patient was advised to  call back or seek an in-person evaluation if the symptoms worsen or if the condition fails to improve as anticipated.   I provided 30 minutes of non-face-to-face time during this encounter.   Winfred Burn, LCSW   10/27/2021

## 2021-10-29 ENCOUNTER — Other Ambulatory Visit (HOSPITAL_COMMUNITY): Payer: Self-pay

## 2021-10-29 MED ORDER — VALACYCLOVIR HCL 1 G PO TABS
1000.0000 mg | ORAL_TABLET | Freq: Every day | ORAL | 2 refills | Status: DC
  Filled 2021-10-29 – 2022-08-04 (×3): qty 30, 30d supply, fill #0
  Filled 2022-10-07: qty 30, 30d supply, fill #1

## 2021-11-06 ENCOUNTER — Other Ambulatory Visit (HOSPITAL_COMMUNITY): Payer: Self-pay

## 2021-11-19 ENCOUNTER — Other Ambulatory Visit (HOSPITAL_COMMUNITY): Payer: Self-pay

## 2021-11-24 ENCOUNTER — Ambulatory Visit (INDEPENDENT_AMBULATORY_CARE_PROVIDER_SITE_OTHER): Payer: Self-pay | Admitting: Clinical

## 2021-11-24 DIAGNOSIS — F419 Anxiety disorder, unspecified: Secondary | ICD-10-CM

## 2021-11-24 DIAGNOSIS — F331 Major depressive disorder, recurrent, moderate: Secondary | ICD-10-CM

## 2021-11-24 NOTE — Progress Notes (Signed)
Virtual Visit via Telephone Note   I connected with Sonia Gibson on 11/24/21 at  3:00 PM EST by telephone and verified that I am speaking with the correct person using two identifiers.   Location: Patient: Home Provider: Office   I discussed the limitations, risks, security and privacy concerns of performing an evaluation and management service by telephone and the availability of in person appointments. I also discussed with the patient that there may be a patient responsible charge related to this service. The patient expressed understanding and agreed to proceed.   THERAPIST PROGRESS NOTE   Session Time: 3:00 PM-3:20 PM   Participation Level: Active   Behavioral Response: CasualAlertDepressed   Type of Therapy: Individual Therapy   Treatment Goals addressed: Coping   Interventions: CBT, DBT, Solution Focused, Strength-based and Supportive   Summary: Sonia Gibson is a 30 y.o. female who presents with Depression and Anxiety. The OPT therapist worked with the patient for her OPT treatment. The OPT therapist utilized Motivational Interviewing to assist in creating therapeutic repore. The patient in the session was engaged and work in collaboration with the OPT therapist overviewing functioning and family interaction. The OPT therapist utilized Cognitive Behavioral Therapy through cognitive restructuring as well as worked with the patient on coping strategies to assist in management of mood including ongoing motivation and empowerment for the patient to be as active. The OPT therapist continued to support and encourage the patient utilize her coping including reading bible, journaling, and praying.The patient spoke about preparation for the upcoming transition for kids to start back to school in the Fall. The patient verbalized that she feels her mood has been steady over the past few weeks and gave a 7 for mood on a 0-10 scale.The patient spoke about her hopes to be able to go to the beach  before school starts back.   Suicidal/Homicidal: Nowithout intent/plan   Therapist Response: The OPT therapist worked with the patient for the patients scheduled session. The patient was engaged in her session and gave feedback in relation to triggers, symptoms, and behavior responses over the past few weeks The patient has been working to actively implement tools to improve her basic health need areas around sleep, eating habits, hygiene, and physical exercise.The OPT therapist worked with the patient utilizing an in session Cognitive Behavioral Therapy exercise. The patient was responsive in the session and verbalized, " I have been doing my workouts/exercise and sleep has been going good".  The patient noted that she is still in the process working on getting full custody of her son and there has not been much movement/change yet with the process.The patient noted no upcoming health appointments and no current physical health problems.The OPT therapist will continue treatment work with the patient in her next scheduled session.      Plan: Return again in 2 weeks.   Diagnosis:      Axis I: Major depressive disorder, recurrent episode, moderate with anxious distress                            Axis II: No diagnosis   Collaboration of Care: No additional collaboration of care for this session.   Patient/Guardian was advised Release of Information must be obtained prior to any record release in order to collaborate their care with an outside provider. Patient/Guardian was advised if they have not already done so to contact the registration department to sign all necessary forms in order for  [place] to release information regarding their care.    Consent: Patient/Guardian gives verbal consent for treatment and assignment of benefits for services provided during this visit. Patient/Guardian expressed understanding and agreed to proceed.      I discussed the assessment and treatment plan with the patient.  The patient was provided an opportunity to ask questions and all were answered. The patient agreed with the plan and demonstrated an understanding of the instructions.   The patient was advised to call back or seek an in-person evaluation if the symptoms worsen or if the condition fails to improve as anticipated.   I provided 20 minutes of non-face-to-face time during this encounter.   Winfred Burn, LCSW   11/24/2021

## 2021-12-22 ENCOUNTER — Ambulatory Visit (INDEPENDENT_AMBULATORY_CARE_PROVIDER_SITE_OTHER): Payer: No Typology Code available for payment source | Admitting: Clinical

## 2021-12-22 DIAGNOSIS — F331 Major depressive disorder, recurrent, moderate: Secondary | ICD-10-CM

## 2021-12-22 DIAGNOSIS — F419 Anxiety disorder, unspecified: Secondary | ICD-10-CM | POA: Diagnosis not present

## 2021-12-22 NOTE — Progress Notes (Signed)
Virtual Visit via Telephone Note   I connected with Sonia Gibson on 12/22/21 at  3:00 PM EST by telephone and verified that I am speaking with the correct person using two identifiers.   Location: Patient: Home Provider: Office   I discussed the limitations, risks, security and privacy concerns of performing an evaluation and management service by telephone and the availability of in person appointments. I also discussed with the patient that there may be a patient responsible charge related to this service. The patient expressed understanding and agreed to proceed.   THERAPIST PROGRESS NOTE   Session Time: 3:00 PM-3:25 PM   Participation Level: Active   Behavioral Response: CasualAlertDepressed   Type of Therapy: Individual Therapy   Treatment Goals addressed: Coping   Interventions: CBT, DBT, Solution Focused, Strength-based and Supportive   Summary: Sonia Gibson is a 31 y.o. female who presents with Depression and Anxiety. The OPT therapist worked with the patient for her OPT treatment. The OPT therapist utilized Motivational Interviewing to assist in creating therapeutic repore. The patient in the session was engaged and work in collaboration with the OPT therapist overviewing functioning and family interaction. The OPT therapist utilized Cognitive Behavioral Therapy through cognitive restructuring as well as worked with the patient on coping strategies to assist in management of mood including ongoing motivation and empowerment for the patient to be as active. The OPT therapist continued to support and encourage the patient utilize her coping including reading bible, journaling, and praying.The patient spoke about ongoing preparation for the upcoming transition for kids to start back to school in the Fall. The patient verbalized that she feels her mood has been steady over the past few weeks, however, the patient spoke about a recent change at her work with her supervisor leaving and  a new supervisor taking her place. The patient is currently looking to make a change/transition with her work either inside or outside of the company to a new position.   Suicidal/Homicidal: Nowithout intent/plan   Therapist Response: The OPT therapist worked with the patient for the patients scheduled session. The patient was engaged in her session and gave feedback in relation to triggers, symptoms, and behavior responses over the past few weeks The patient has been working to actively implement tools to improve her basic health need areas around sleep, eating habits, hygiene, and physical exercise.The OPT therapist worked with the patient utilizing an in session Cognitive Behavioral Therapy exercise. The patient was responsive in the session and verbalized, " I have had difficulty adjusting to my old supervisor leaving and it has pushed me to continue to try to look for another position".  The patient noted she is looking to go on vacation to the beach before the end of the month when school starts back..The patient noted no upcoming health appointments and reviewed recently changing her primary care physician.The OPT therapist will continue treatment work with the patient in her next scheduled session.      Plan: Return again in 2 weeks.   Diagnosis:      Axis I: Major depressive disorder, recurrent episode, moderate with anxious distress                            Axis II: No diagnosis   Collaboration of Care: No additional collaboration of care for this session.   Patient/Guardian was advised Release of Information must be obtained prior to any record release in order to collaborate their care  with an outside provider. Patient/Guardian was advised if they have not already done so to contact the registration department to sign all necessary forms in order for [place] to release information regarding their care.    Consent: Patient/Guardian gives verbal consent for treatment and assignment of  benefits for services provided during this visit. Patient/Guardian expressed understanding and agreed to proceed.      I discussed the assessment and treatment plan with the patient. The patient was provided an opportunity to ask questions and all were answered. The patient agreed with the plan and demonstrated an understanding of the instructions.   The patient was advised to call back or seek an in-person evaluation if the symptoms worsen or if the condition fails to improve as anticipated.   I provided 25 minutes of non-face-to-face time during this encounter.   Winfred Burn, LCSW   12/22/2021

## 2022-01-13 ENCOUNTER — Ambulatory Visit (INDEPENDENT_AMBULATORY_CARE_PROVIDER_SITE_OTHER): Payer: No Typology Code available for payment source | Admitting: Clinical

## 2022-01-13 DIAGNOSIS — F331 Major depressive disorder, recurrent, moderate: Secondary | ICD-10-CM

## 2022-01-13 DIAGNOSIS — F419 Anxiety disorder, unspecified: Secondary | ICD-10-CM

## 2022-01-13 NOTE — Progress Notes (Signed)
Virtual Visit via Telephone Note   I connected with Sonia Gibson on 01/13/22 at  3:00 PM EST by telephone and verified that I am speaking with the correct person using two identifiers.   Location: Patient: Home Provider: Office   I discussed the limitations, risks, security and privacy concerns of performing an evaluation and management service by telephone and the availability of in person appointments. I also discussed with the patient that there may be a patient responsible charge related to this service. The patient expressed understanding and agreed to proceed.   THERAPIST PROGRESS NOTE   Session Time: 3:00 PM-3:25 PM   Participation Level: Active   Behavioral Response: CasualAlertDepressed   Type of Therapy: Individual Therapy   Treatment Goals addressed: Coping   Interventions: CBT, DBT, Solution Focused, Strength-based and Supportive   Summary: Sonia Gibson is a 31 y.o. female who presents with Depression and Anxiety. The OPT therapist worked with the patient for her OPT treatment. The OPT therapist utilized Motivational Interviewing to assist in creating therapeutic repore. The patient in the session was engaged and work in collaboration with the OPT therapist overviewing functioning and family interaction. The OPT therapist utilized Cognitive Behavioral Therapy through cognitive restructuring as well as worked with the patient on coping strategies to assist in management of mood including ongoing motivation and empowerment for the patient to be active. The OPT therapist continued to support and encourage the patient utilize her coping including reading bible, journaling, and praying.The patient spoke about adjusting to the transition of  kids  starting  back to school in the Fall. The patient verbalized that she feels her mood has been steady over the past few weeks. The patient is currently looking to make a change/transition with her work either inside or outside of the  company to a new position.   Suicidal/Homicidal: Nowithout intent/plan   Therapist Response: The OPT therapist worked with the patient for the patients scheduled session. The patient was engaged in her session and gave feedback in relation to triggers, symptoms, and behavior responses over the past few weeks The patient has been working to actively implement tools to improve her basic health need areas around sleep, eating habits, hygiene, and physical exercise.The OPT therapist worked with the patient utilizing an in session Cognitive Behavioral Therapy exercise. The patient was responsive in the session and verbalized, " I have had a few interviews but I didn't get the offer so I am continuing to work on my metrics at work and still putting in for other jobs".  The patient noted she is looking to go on vacation to the beach in the 3rd week of September..The patient noted no upcoming health appointments and reviewed recently changing her primary care physician for blood work.The OPT therapist will continue treatment work with the patient in her next scheduled session.      Plan: Return again in 2 weeks.   Diagnosis:      Axis I: Major depressive disorder, recurrent episode, moderate with anxious distress                            Axis II: No diagnosis   Collaboration of Care: No additional collaboration of care for this session.   Patient/Guardian was advised Release of Information must be obtained prior to any record release in order to collaborate their care with an outside provider. Patient/Guardian was advised if they have not already done so to contact the registration  department to sign all necessary forms in order for [place] to release information regarding their care.    Consent: Patient/Guardian gives verbal consent for treatment and assignment of benefits for services provided during this visit. Patient/Guardian expressed understanding and agreed to proceed.      I discussed the  assessment and treatment plan with the patient. The patient was provided an opportunity to ask questions and all were answered. The patient agreed with the plan and demonstrated an understanding of the instructions.   The patient was advised to call back or seek an in-person evaluation if the symptoms worsen or if the condition fails to improve as anticipated.   I provided 25 minutes of non-face-to-face time during this encounter.   Winfred Burn, LCSW   01/13/2022

## 2022-01-26 ENCOUNTER — Telehealth: Payer: No Typology Code available for payment source | Admitting: Physician Assistant

## 2022-01-26 DIAGNOSIS — N760 Acute vaginitis: Secondary | ICD-10-CM

## 2022-01-26 MED ORDER — METRONIDAZOLE 0.75 % VA GEL: 1.0000 | Freq: Two times a day (BID) | VAGINAL | 0 refills | Status: AC

## 2022-01-26 NOTE — Progress Notes (Signed)

## 2022-02-10 ENCOUNTER — Ambulatory Visit (INDEPENDENT_AMBULATORY_CARE_PROVIDER_SITE_OTHER): Payer: No Typology Code available for payment source | Admitting: Clinical

## 2022-02-10 DIAGNOSIS — F419 Anxiety disorder, unspecified: Secondary | ICD-10-CM

## 2022-02-10 DIAGNOSIS — F331 Major depressive disorder, recurrent, moderate: Secondary | ICD-10-CM

## 2022-02-10 NOTE — Progress Notes (Signed)
Virtual Visit via Telephone Note   I connected with Sonia Gibson on 02/10/22 at  3:00 PM EST by telephone and verified that I am speaking with the correct person using two identifiers.   Location: Patient: Home Provider: Office   I discussed the limitations, risks, security and privacy concerns of performing an evaluation and management service by telephone and the availability of in person appointments. I also discussed with the patient that there may be a patient responsible charge related to this service. The patient expressed understanding and agreed to proceed.   THERAPIST PROGRESS NOTE   Session Time: 3:00 PM-3:30 PM   Participation Level: Active   Behavioral Response: CasualAlertDepressed   Type of Therapy: Individual Therapy   Treatment Goals addressed: Coping   Interventions: CBT, DBT, Solution Focused, Strength-based and Supportive   Summary: Sonia Gibson is a 31 y.o. female who presents with Depression and Anxiety. The OPT therapist worked with the patient for her OPT treatment. The OPT therapist utilized Motivational Interviewing to assist in creating therapeutic repore. The patient in the session was engaged and work in collaboration with the OPT therapist overviewing functioning and family interaction. The OPT therapist utilized Cognitive Behavioral Therapy through cognitive restructuring as well as worked with the patient on coping strategies to assist in management of mood including ongoing motivation and empowerment for the patient to be active. The OPT therapist continued to support and encourage the patient utilize her coping including reading bible, journaling, and praying.The patient spoke about a recent failed relationship that has been a trigger for her and worked in session with the OPT therapist on re-establishing her foundation, self esteem, and self confidence. . The patient is currently looking to make a change/transition with her work either inside or outside  of the company to a new position.   Suicidal/Homicidal: Nowithout intent/plan   Therapist Response: The OPT therapist worked with the patient for the patients scheduled session. The patient was engaged in her session and gave feedback in relation to triggers, symptoms, and behavior responses over the past few weeks The patient has been working to actively implement tools to improve her basic health need areas around sleep, eating habits, hygiene, and physical exercise.The OPT therapist worked with the patient utilizing an in session Cognitive Behavioral Therapy exercise. The patient was responsive in the session and verbalized, " I just feel like I wasted so much time and energy and I have been upset all day because I realized it has not worked out and I got into it with someone I had been seeing for 3years".  The OPT therapist worked with the patient to empower the patient.The patient noted she is going to take time for herself and not close herself off to things she can enjoy moving forward while taking time to work on herself.The OPT therapist will continue treatment work with the patient in her next scheduled session.      Plan: Return again in 2 weeks.   Diagnosis:      Axis I: Major depressive disorder, recurrent episode, moderate with anxious distress                            Axis II: No diagnosis   Collaboration of Care: No additional collaboration of care for this session.   Patient/Guardian was advised Release of Information must be obtained prior to any record release in order to collaborate their care with an outside provider. Patient/Guardian was advised if  they have not already done so to contact the registration department to sign all necessary forms in order for [place] to release information regarding their care.    Consent: Patient/Guardian gives verbal consent for treatment and assignment of benefits for services provided during this visit. Patient/Guardian expressed understanding  and agreed to proceed.      I discussed the assessment and treatment plan with the patient. The patient was provided an opportunity to ask questions and all were answered. The patient agreed with the plan and demonstrated an understanding of the instructions.   The patient was advised to call back or seek an in-person evaluation if the symptoms worsen or if the condition fails to improve as anticipated.   I provided 30 minutes of non-face-to-face time during this encounter.   Lennox Grumbles, LCSW   02/10/2022

## 2022-02-25 ENCOUNTER — Other Ambulatory Visit: Payer: Self-pay | Admitting: Family Medicine

## 2022-02-25 DIAGNOSIS — R768 Other specified abnormal immunological findings in serum: Secondary | ICD-10-CM

## 2022-02-27 ENCOUNTER — Other Ambulatory Visit (HOSPITAL_COMMUNITY): Payer: Self-pay

## 2022-03-09 ENCOUNTER — Ambulatory Visit (INDEPENDENT_AMBULATORY_CARE_PROVIDER_SITE_OTHER): Payer: No Typology Code available for payment source | Admitting: Clinical

## 2022-03-09 DIAGNOSIS — F331 Major depressive disorder, recurrent, moderate: Secondary | ICD-10-CM

## 2022-03-09 DIAGNOSIS — F419 Anxiety disorder, unspecified: Secondary | ICD-10-CM | POA: Diagnosis not present

## 2022-03-09 NOTE — Progress Notes (Signed)
Virtual Visit via Telephone Note   I connected with Sonia Gibson on 03/09/22 at  3:00 PM EST by telephone and verified that I am speaking with the correct person using two identifiers.   Location: Patient: Home Provider: Office   I discussed the limitations, risks, security and privacy concerns of performing an evaluation and management service by telephone and the availability of in person appointments. I also discussed with the patient that there may be a patient responsible charge related to this service. The patient expressed understanding and agreed to proceed.   THERAPIST PROGRESS NOTE   Session Time: 3:00 PM-3:30 PM   Participation Level: Active   Behavioral Response: CasualAlertDepressed   Type of Therapy: Individual Therapy   Treatment Goals addressed: Coping   Interventions: CBT, DBT, Solution Focused, Strength-based and Supportive   Summary: Sonia Gibson is a 31 y.o. female who presents with Depression and Anxiety. The OPT therapist worked with the patient for her OPT treatment. The OPT therapist utilized Motivational Interviewing to assist in creating therapeutic repore. The patient in the session was engaged and work in collaboration with the OPT therapist overviewing functioning and family interaction. The OPT therapist utilized Cognitive Behavioral Therapy through cognitive restructuring as well as worked with the patient on coping strategies to assist in management of mood. The patient in this session spoke about recent large change of adding a part time which will be a second job for the patient.The OPT therapist continued to support and encourage the patient utilize her coping including reading bible, journaling, and praying.The patient spoke about her training and experience so far with working 2 jobs this was primarily to gaining extra income.   Suicidal/Homicidal: Nowithout intent/plan   Therapist Response: The OPT therapist worked with the patient for the  patients scheduled session. The patient was engaged in her session and gave feedback in relation to triggers, symptoms, and behavior responses over the past few weeks The patient has been working to actively implement tools to improve her basic health need areas around sleep, eating habits, hygiene, and physical exercise.The OPT therapist worked with the patient utilizing an in session Cognitive Behavioral Therapy exercise. The patient was responsive in the session and verbalized, " I am going to see how working both jobs works out and with trying to keep the schedules straight and see if it is worth it financially to keep both jobs". The patient noted she is going to take time for herself and identified upcoming events including child's birthday tomorrow, upcoming holidays, and a self care and hair appointment day.The OPT therapist will continue treatment work with the patient including monitoring the patients adjustment to adding another job in her next scheduled session.      Plan: Return again in 2 weeks.   Diagnosis:      Axis I: Major depressive disorder, recurrent episode, moderate with anxious distress                            Axis II: No diagnosis   Collaboration of Care: No additional collaboration of care for this session.   Patient/Guardian was advised Release of Information must be obtained prior to any record release in order to collaborate their care with an outside provider. Patient/Guardian was advised if they have not already done so to contact the registration department to sign all necessary forms in order for [place] to release information regarding their care.    Consent: Patient/Guardian gives verbal consent  for treatment and assignment of benefits for services provided during this visit. Patient/Guardian expressed understanding and agreed to proceed.      I discussed the assessment and treatment plan with the patient. The patient was provided an opportunity to ask questions and  all were answered. The patient agreed with the plan and demonstrated an understanding of the instructions.   The patient was advised to call back or seek an in-person evaluation if the symptoms worsen or if the condition fails to improve as anticipated.   I provided 30 minutes of non-face-to-face time during this encounter.   Lennox Grumbles, LCSW   03/09/2022

## 2022-03-09 NOTE — Plan of Care (Signed)
Verbal Consent 

## 2022-04-13 ENCOUNTER — Ambulatory Visit (INDEPENDENT_AMBULATORY_CARE_PROVIDER_SITE_OTHER): Payer: No Typology Code available for payment source | Admitting: Clinical

## 2022-04-13 DIAGNOSIS — F419 Anxiety disorder, unspecified: Secondary | ICD-10-CM | POA: Diagnosis not present

## 2022-04-13 DIAGNOSIS — F331 Major depressive disorder, recurrent, moderate: Secondary | ICD-10-CM

## 2022-04-13 NOTE — Progress Notes (Signed)
Virtual Visit via Telephone Note   I connected with Sonia Gibson on 04/13/22 at  3:00 PM EST by telephone and verified that I am speaking with the correct person using two identifiers.   Location: Patient: Home Provider: Office   I discussed the limitations, risks, security and privacy concerns of performing an evaluation and management service by telephone and the availability of in person appointments. I also discussed with the patient that there may be a patient responsible charge related to this service. The patient expressed understanding and agreed to proceed.   THERAPIST PROGRESS NOTE   Session Time: 3:00 PM-3:30 PM   Participation Level: Active   Behavioral Response: CasualAlertDepressed   Type of Therapy: Individual Therapy   Treatment Goals addressed: Coping   Interventions: CBT, DBT, Solution Focused, Strength-based and Supportive   Summary: Sonia Gibson is a 31 y.o. female who presents with Depression and Anxiety. The OPT therapist worked with the patient for her OPT treatment. The OPT therapist utilized Motivational Interviewing to assist in creating therapeutic repore. The patient in the session was engaged and work in collaboration with the OPT therapist overviewing functioning and family interaction over the recent Thanksgiving holiday. The OPT therapist utilized Cognitive Behavioral Therapy through cognitive restructuring as well as worked with the patient on coping strategies to assist in management of mood. The patient in this session spoke about willingness to continue to be self focused on her health and making healthy choices.The OPT therapist continued to support and encourage the patient utilize her coping including reading bible, journaling, and praying.The patient spoke about her plans for upcoming Christmas holiday and goals for next year.   Suicidal/Homicidal: Nowithout intent/plan   Therapist Response: The OPT therapist worked with the patient for the  patients scheduled session. The patient was engaged in her session and gave feedback in relation to triggers, symptoms, and behavior responses over the past few weeks The patient continues to actively implement tools to improve her basic health need areas around sleep, eating habits, hygiene, and physical exercise with consistency.The OPT therapist worked with the patient utilizing an in session Cognitive Behavioral Therapy exercise. The patient was responsive in the session and verbalized, " I am working on my interactions with  people who I have an existing relationship with ". The patient noted she is going to take time for herself and healthy changes including her goal of gaining weight.The OPT therapist will continue treatment work with the patient in her next scheduled session.      Plan: Return again in 2 weeks.   Diagnosis:      Axis I: Major depressive disorder, recurrent episode, moderate with anxious distress                            Axis II: No diagnosis   Collaboration of Care: No additional collaboration of care for this session.   Patient/Guardian was advised Release of Information must be obtained prior to any record release in order to collaborate their care with an outside provider. Patient/Guardian was advised if they have not already done so to contact the registration department to sign all necessary forms in order for [place] to release information regarding their care.    Consent: Patient/Guardian gives verbal consent for treatment and assignment of benefits for services provided during this visit. Patient/Guardian expressed understanding and agreed to proceed.      I discussed the assessment and treatment plan with the patient. The patient  was provided an opportunity to ask questions and all were answered. The patient agreed with the plan and demonstrated an understanding of the instructions.   The patient was advised to call back or seek an in-person evaluation if the symptoms  worsen or if the condition fails to improve as anticipated.   I provided 30 minutes of non-face-to-face time during this encounter.   Winfred Burn, LCSW   04/13/2022

## 2022-04-13 NOTE — Plan of Care (Signed)
Verbal consent  

## 2022-05-19 ENCOUNTER — Ambulatory Visit (INDEPENDENT_AMBULATORY_CARE_PROVIDER_SITE_OTHER): Payer: No Typology Code available for payment source | Admitting: Clinical

## 2022-05-19 DIAGNOSIS — F331 Major depressive disorder, recurrent, moderate: Secondary | ICD-10-CM

## 2022-05-19 DIAGNOSIS — F419 Anxiety disorder, unspecified: Secondary | ICD-10-CM

## 2022-05-19 NOTE — Progress Notes (Signed)
Virtual Visit via Video Note  I connected with Sonia Gibson on 05/19/22 at  3:00 PM EST by a video enabled telemedicine application and verified that I am speaking with the correct person using two identifiers.  Location: Patient: Home Provider: Office   I discussed the limitations of evaluation and management by telemedicine and the availability of in person appointments. The patient expressed understanding and agreed to proceed.   THERAPIST PROGRESS NOTE   Session Time: 3:00 PM-3:30 PM   Participation Level: Active   Behavioral Response: CasualAlertDepressed   Type of Therapy: Individual Therapy   Treatment Goals addressed: Coping to assist with Depression and Anxiety   Interventions: CBT, DBT, Solution Focused, Strength-based and Supportive   Summary: Sonia Gibson is a 32 y.o. female who presents with Depression and Anxiety. The OPT therapist worked with the patient for her OPT treatment. The OPT therapist utilized Motivational Interviewing to assist in creating therapeutic repore. 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 The OPT therapist utilized Cognitive Behavioral Therapy through cognitive restructuring as well as worked with the patient on coping strategies to assist in management of mood and manage her identified work related stress. The patient in this session spoke about willingness to continue to be self focused on her health and making healthy choices.The OPT therapist continued to support and encourage the patient utilize her coping including reading bible, journaling, and praying.The patient spoke about her plans for the New Year including upcoming interaction with her supervisor in February to learn more about how to use the new electronic system.   Suicidal/Homicidal: Nowithout intent/plan    Therapist Response: The OPT therapist worked with the patient for the patients scheduled session. The patient was engaged in her session and gave feedback in relation to triggers, symptoms, and behavior responses over the past few weeks The patient continues to actively implement tools to improve her basic health need areas around sleep, eating habits, hygiene, and physical exercise with consistency.The OPT therapist worked with the patient utilizing an in session Cognitive Behavioral Therapy exercise. The patient was responsive in the session and verbalized, " I am working with a new platform but I do not have full access I need and I am hoping to get time with my supervisor one on one to get more information and training ". The patient noted she is going to take time for herself and healthy changes including her goal of gaining weight (patient has been drinking boost shakes to gain) as well as potentially going back to school to learn medical billing.The OPT therapist will continue treatment work with the patient in her next scheduled session.      Plan: Return again in 2 weeks.   Diagnosis:      Axis I: Major depressive disorder, recurrent episode, moderate with anxious distress                            Axis II: No diagnosis   Collaboration of Care: No additional collaboration of care for this session.   Patient/Guardian was advised Release of Information must be obtained prior to any record release in order to collaborate their care with an outside provider. Patient/Guardian was advised if they have not already done so to contact the registration department to sign all necessary forms in order for Sonia Gibson  to release information regarding their care.    Consent: Patient/Guardian gives verbal consent for treatment and assignment of benefits for services provided during this visit. Patient/Guardian expressed understanding and agreed to proceed.      I discussed the assessment and treatment plan with  the patient. The patient was provided an opportunity to ask questions and all were answered. The patient agreed with the plan and demonstrated an understanding of the instructions.   The patient was advised to call back or seek an in-person evaluation if the symptoms worsen or if the condition fails to improve as anticipated.   I provided 30 minutes of non-face-to-face time during this encounter.   Lennox Grumbles, LCSW   05/19/2022

## 2022-06-22 ENCOUNTER — Ambulatory Visit (INDEPENDENT_AMBULATORY_CARE_PROVIDER_SITE_OTHER): Payer: No Typology Code available for payment source | Admitting: Clinical

## 2022-07-28 ENCOUNTER — Ambulatory Visit (INDEPENDENT_AMBULATORY_CARE_PROVIDER_SITE_OTHER): Payer: No Typology Code available for payment source | Admitting: Clinical

## 2022-07-28 DIAGNOSIS — F331 Major depressive disorder, recurrent, moderate: Secondary | ICD-10-CM | POA: Diagnosis not present

## 2022-07-28 DIAGNOSIS — F419 Anxiety disorder, unspecified: Secondary | ICD-10-CM

## 2022-07-28 NOTE — Progress Notes (Signed)
Virtual Visit via Video Note   I connected with Sonia Gibson on 07/28/22 at  3:00 PM EST by a video enabled telemedicine application and verified that I am speaking with the correct person using two identifiers.   Location: Patient: Home Provider: Office   I discussed the limitations of evaluation and management by telemedicine and the availability of in person appointments. The patient expressed understanding and agreed to proceed.     THERAPIST PROGRESS NOTE   Session Time: 3:00 PM-3:30 PM   Participation Level: Active   Behavioral Response: CasualAlertDepressed   Type of Therapy: Individual Therapy   Treatment Goals addressed: Coping to assist with Depression and Anxiety   Interventions: CBT, DBT, Solution Focused, Strength-based and Supportive   Summary: Tacoya Clinch is a 32 y.o. female who presents with Depression and Anxiety. The OPT therapist worked with the patient for her OPT treatment. The OPT therapist utilized Motivational Interviewing to assist in creating therapeutic repore. 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 The patient spoke about broadening her horizons and spoke about potentially trying going out with someone she knows from [place].The OPT therapist utilized Cognitive Behavioral Therapy through cognitive restructuring as well as worked with the patient on coping strategies to assist in management of mood and manage her identified work related stress. The patient in this session spoke about willingness to continue to be self focused on her health and making healthy choices.The OPT therapist continued to support and encourage the patient utilize her coping including reading bible, journaling, and praying. The patient spoke about upcoming Easter holiday and adjusting to [employer].   Suicidal/Homicidal: Nowithout intent/plan   Therapist Response:  The OPT therapist worked with the patient for the patients scheduled session. The patient was engaged in her session and gave feedback in relation to triggers, symptoms, and behavior responses over the past few weeks The patient continues to actively implement tools to improve her basic health need areas around sleep, eating habits, hygiene, and physical exercise with consistency.The OPT therapist worked with the patient utilizing an in session Cognitive Behavioral Therapy exercise. The patient was responsive in the session and verbalized, " I am enjoying the extended day light and I think for Easter I am going to just go to [place] and have a dinner ". The patient noted she is going to take time for herself and healthy changes including her goal of gaining weight and noted she has been eating more but not using the shakes consistently. The patient spoke about potentially going on vacation to the beach in May. The OPT therapist will continue treatment work with the patient in her next scheduled session.      Plan: Return again in 2 weeks.   Diagnosis:      Axis I: Major depressive disorder, recurrent episode, moderate with anxious distress                            Axis II: No diagnosis   Collaboration of Care: No additional collaboration of care for this session.   Patient/Guardian was advised Release of Information must be obtained prior to any record release in order to collaborate their care with an outside provider. Patient/Guardian was advised if they have not already done so to contact the registration department to sign all necessary forms in order for [place] to release information regarding their care.  Consent: Patient/Guardian gives verbal consent for treatment and assignment of benefits for services provided during this visit. Patient/Guardian expressed understanding and agreed to proceed.      I discussed the assessment and treatment plan with the patient. The patient was provided an  opportunity to ask questions and all were answered. The patient agreed with the plan and demonstrated an understanding of the instructions.   The patient was advised to call back or seek an in-person evaluation if the symptoms worsen or if the condition fails to improve as anticipated.   I provided 30 minutes of non-face-to-face time during this encounter.   Lennox Grumbles, LCSW   07/28/2022

## 2022-08-04 ENCOUNTER — Other Ambulatory Visit (HOSPITAL_COMMUNITY): Payer: Self-pay

## 2022-08-04 ENCOUNTER — Other Ambulatory Visit: Payer: Self-pay

## 2022-08-05 ENCOUNTER — Telehealth: Payer: No Typology Code available for payment source

## 2022-08-05 ENCOUNTER — Encounter: Payer: No Typology Code available for payment source | Admitting: Physician Assistant

## 2022-08-05 ENCOUNTER — Telehealth: Payer: No Typology Code available for payment source | Admitting: Nurse Practitioner

## 2022-08-05 DIAGNOSIS — L235 Allergic contact dermatitis due to other chemical products: Secondary | ICD-10-CM

## 2022-08-05 NOTE — Telephone Encounter (Signed)
This encounter was created in error - please disregard.

## 2022-08-05 NOTE — Progress Notes (Signed)
Virtual Visit Consent   Sonia Gibson, you are scheduled for a virtual visit with Mary-Margaret Hassell Done, [place], a [facility] provider, today.     Just as with appointments in the office, your consent must be obtained to participate.  Your consent will be active for this visit and any virtual visit you may have with one of our providers in the next 365 days.     If you have a MyChart account, a copy of this consent can be sent to you electronically.  All virtual visits are billed to your insurance company just like a traditional visit in the office.    As this is a virtual visit, video technology does not allow for your provider to perform a traditional examination.  This may limit your provider's ability to fully assess your condition.  If your provider identifies any concerns that need to be evaluated in person or the need to arrange testing (such as labs, EKG, etc.), we will make arrangements to do so.     Although advances in technology are sophisticated, we cannot ensure that it will always work on either your end or our end.  If the connection with a video visit is poor, the visit may have to be switched to a telephone visit.  With either a video or telephone visit, we are not always able to ensure that we have a secure connection.     I need to obtain your verbal consent now.   Are you willing to proceed with your visit today? YES   Sonia Gibson has provided verbal consent on 08/05/2022 for a virtual visit (video or telephone).   Mary-Margaret Hassell Done, FNP   Date: 08/05/2022 2:25 PM   Virtual Visit via Video Note   I, Mary-Margaret Hassell Done, connected with Sonia Gibson ([id number], 1991/04/06) on 08/05/22 at  2:30 PM EDT by a video-enabled telemedicine application and verified that I am speaking with the correct person using two identifiers.  Location: Patient: Virtual Visit Location Patient: Home Provider: Virtual Visit Location Provider: Mobile   I discussed the limitations of  evaluation and management by telemedicine and the availability of in person appointments. The patient expressed understanding and agreed to proceed.    History of Present Illness: Sonia Gibson is a 32 y.o. who identifies as a female who was assigned female at birth, and is being seen today allergies  .  HPI: She braided her hair with a new brand of hair and her face started itching. Rash broke out on her cheeks and neg. She tried watching her face with alcohol and applied hydrocortisone. That helped some. She took the new hair out today and washed her face again . Still itching.    Review of Systems  Constitutional:  Negative for diaphoresis and weight loss.  Eyes:  Negative for blurred vision, double vision and pain.  Respiratory:  Negative for shortness of breath.   Cardiovascular:  Negative for chest pain, palpitations, orthopnea and leg swelling.  Gastrointestinal:  Negative for abdominal pain.  Skin:  Negative for rash.  Neurological:  Negative for dizziness, sensory change, loss of consciousness, weakness and headaches.  Endo/Heme/Allergies:  Negative for polydipsia. Does not bruise/bleed easily.  Psychiatric/Behavioral:  Negative for memory loss. The patient does not have insomnia.   All other systems reviewed and are negative.   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 Insert 1 Applicatorful of medication into the vagina once nightly x 7 nights, Disp: 40 g, Rfl: 0   metroNIDAZOLE (METROGEL) 0.75 % vaginal gel, Place 1 Applicatorful vaginally 2 (two) times  daily., Disp: 70 g, Rfl: 0   Norgestimate-Ethinyl Estradiol Triphasic (ORTHO TRI-CYCLEN LO) 0.18/0.215/0.25 MG-25 MCG tab, Take 1 tablet by mouth daily., Disp: 28 tablet, Rfl: 11   valACYclovir (VALTREX) 1000 MG tablet, Take 1 tablet (1,000 mg total) by mouth daily., Disp: 90 tablet, Rfl: 3   valACYclovir (VALTREX) 1000 MG tablet, Take 1 tablet (1,000 mg total) by mouth daily., Disp: 90 tablet, Rfl: 2  Observations/Objective: Patient is well-developed, well-nourished in no acute distress.  Resting comfortably  at home.  Head is normocephalic, atraumatic.  No labored breathing.  Speech is clear and coherent with logical content.  Patient is alert and oriented at baseline.  Papular lesion on cheeks, around hair line and on neck.  Assessment and Plan:  Sonia Gibson in today with chief complaint of Allergies   1. Allergic dermatitis due to other chemical product Good that irritant was removed Avoid scratching area Cool compresses Do not use steroid cream on face Vaseline if helps Benadryl OTC. Benadryl cream are ok on face    Follow Up Instructions: I discussed the assessment and treatment plan with the patient. The patient was provided an opportunity to ask questions and all were answered. The patient agreed with the plan and demonstrated an understanding of the instructions.  A copy of instructions were sent to the patient via MyChart.  The patient was advised to call back or seek an in-person evaluation if the symptoms worsen or if the condition fails to improve as anticipated.  Time:  I spent 13 minutes with the patient via telehealth technology discussing the above problems/concerns.    Mary-Margaret Hassell Done, FNP

## 2022-08-05 NOTE — Patient Instructions (Signed)
  [name], thank you for joining Chevis Pretty, FNP for today's virtual visit.  While this provider is not your primary care provider (PCP), if your PCP is located in our provider database this encounter information will be shared with them immediately following your visit.   [place] account gives you access to today's visit and all your visits, tests, and labs performed at [facility] " click here if you don't have a [place] account or go to [email or web address]  Consent: (Patient) Sonia Gibson provided verbal consent for this virtual visit at the beginning of the encounter.  Current Medications:  Current Outpatient Medications:    cetirizine (ZYRTEC) 10 MG tablet, Take 1 tablet (10 mg total) by mouth daily., Disp: 30 tablet, Rfl: 11   clindamycin (CLEOCIN) 2 % vaginal cream, Place 1 Applicatorful vaginally at bedtime. Insert 1 Applicatorful of medication into the vagina once nightly x 7 nights, Disp: 40 g, Rfl: 0   metroNIDAZOLE (METROGEL) 0.75 % vaginal gel, Place 1 Applicatorful vaginally 2 (two) times daily., Disp: 70 g, Rfl: 0   Norgestimate-Ethinyl Estradiol Triphasic (ORTHO TRI-CYCLEN LO) 0.18/0.215/0.25 MG-25 MCG tab, Take 1 tablet by mouth daily., Disp: 28 tablet, Rfl: 11   valACYclovir (VALTREX) 1000 MG tablet, Take 1 tablet (1,000 mg total) by mouth daily., Disp: 90 tablet, Rfl: 3   valACYclovir (VALTREX) 1000 MG tablet, Take 1 tablet (1,000 mg total) by mouth daily., Disp: 90 tablet, Rfl: 2   Medications ordered in this encounter:  No orders of the defined types were placed in this encounter.    *If you need refills on other medications prior to your next appointment, please contact your pharmacy*  Follow-Up: Call back or seek an in-person evaluation if the symptoms worsen or if the condition fails to improve as anticipated.  [place] [phone]  Other Instructions Good that irritant was  removed Avoid scratching area Cool compresses Do not use steroid cream on face Vaseline if helps Benadryl OTC. Benadryl cream are ok on face   If you have been instructed to have an in-person evaluation today at a local Urgent Care facility, please use the link below. It will take you to a list of all of our available [facility], including address, phone number and hours of operation. Please do not delay care.  [facility]  If you or a family member do not have a primary care provider, use the link below to schedule a visit and establish care. When you choose a [place] primary care physician or advanced practice provider, you gain a long-term partner in health. Find a Primary Care Provider  Learn more about [facility]'s in-office and virtual care options: [facility]

## 2022-08-05 NOTE — Progress Notes (Signed)
Duplicate, has VV scheduled at 2:30. Advised to keep. Marked erroneous

## 2022-08-08 ENCOUNTER — Encounter: Payer: Self-pay | Admitting: Internal Medicine

## 2022-08-08 LAB — GLUCOSE, POCT (MANUAL RESULT ENTRY): Glucose Fasting, POC: 93 mg/dL (ref 70–99)

## 2022-08-08 NOTE — Progress Notes (Signed)
Correct Visit

## 2022-08-08 NOTE — Progress Notes (Signed)
No recommendations.

## 2022-08-08 NOTE — Progress Notes (Signed)
Incorrect provider list should be Mayra Reel

## 2022-08-11 ENCOUNTER — Ambulatory Visit
Admission: EM | Admit: 2022-08-11 | Discharge: 2022-08-11 | Disposition: A | Discharge status: Home / Self Care | Payer: No Typology Code available for payment source | Attending: Nurse Practitioner | Admitting: Nurse Practitioner

## 2022-08-11 ENCOUNTER — Other Ambulatory Visit (HOSPITAL_COMMUNITY): Payer: Self-pay

## 2022-08-11 ENCOUNTER — Encounter: Payer: Self-pay | Admitting: *Deleted

## 2022-08-11 DIAGNOSIS — L259 Unspecified contact dermatitis, unspecified cause: Secondary | ICD-10-CM | POA: Diagnosis not present

## 2022-08-11 DIAGNOSIS — T7840XA Allergy, unspecified, initial encounter: Secondary | ICD-10-CM

## 2022-08-11 MED ORDER — FAMOTIDINE 20 MG PO TABS: 20.0000 mg | ORAL_TABLET | Freq: Every day | ORAL | 0 refills | Status: DC

## 2022-08-11 MED ORDER — DEXAMETHASONE SODIUM PHOSPHATE 10 MG/ML IJ SOLN
10.0000 mg | INTRAMUSCULAR | Status: AC
  Administered 2022-08-11: 10 mg via INTRAMUSCULAR

## 2022-08-11 NOTE — ED Triage Notes (Signed)
Pt reports her face broke out due to putting synthetic braiding hair in her hair x 2 days. She states it cleared up slightly now it is back on her entire face. Her back is also itching as a sxs

## 2022-08-11 NOTE — Progress Notes (Signed)
Pt attended 08/08/22 screening event where screening results were wnl. Event documentation indicated pt has changed PCP to Dr. Early Osmond - call made to pt to confirm this change with upcoming appt w/ Dr. Doristine Bosworth on 09/11/22. Pr verbalized no additional support needed- no additional SDOH barriers noted during event and pt has ongoing [facility] access, including upcoming appt on 09/07/22. PCP of record changed per pt request in [facility]. No additional health equity team support indicated at this time.

## 2022-08-11 NOTE — Discharge Instructions (Addendum)
Take medication as prescribed. Take over-the-counter Zyrtec each morning, and Benadryl at bedtime for the next 5 to 7 days or until symptoms improve. Clean the face with warm water and a soap like Dove unscented soap. Recommend using moisturizer such as CeraVe, Eucerin, or Aquaphor, while symptoms persist. Avoid triggers that are causing your symptoms. If you develop tongue swelling, lip swelling, throat swelling, have difficulty breathing, please go to the emergency department for further evaluation. Follow-up as needed.

## 2022-08-11 NOTE — ED Provider Notes (Signed)
[facility]    CSN: [id number] Arrival date & time: 08/11/22  1700      History   Chief Complaint Chief Complaint  Patient presents with   Rash    HPI Sonia Gibson is a 32 y.o. female.   The history is provided by the patient.   The patient presents for rash and itching to her face that started over the past week.  Patient states that she braided her hair, and used a synthetic braiding hair.  She states approximately 1 hour later, she developed welts on each side of her face.  She completed an e-visit, and took Benadryl for her symptoms.  Symptoms somewhat improved.  She states this morning, she uses castor oil on her face, she states when she washed it later on this afternoon, she developed itching, and small pimples all over her face.  She continues to complain of itching to her face, neck, and back.  She denies fever, chills, lip swelling, tongue swelling, throat swelling, shortness of breath, or difficulty breathing.  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 Authorizing Provider  famotidine (PEPCID) 20 MG tablet Take 1 tablet (20 mg total) by mouth daily for 15 days. 08/11/22 08/26/22 Yes Caleen Taaffe-Warren, Alda Lea, NP  cetirizine (ZYRTEC) 10 MG tablet Take 1 tablet (10 mg total) by mouth daily. 12/29/19   Billie Ruddy, MD   clindamycin (CLEOCIN) 2 % vaginal cream Place 1 Applicatorful vaginally at bedtime. Insert 1 Applicatorful of medication into the vagina once nightly x 7 nights 01/08/21   Brunetta Jeans, PA-C  metroNIDAZOLE (METROGEL) 0.75 % vaginal gel Place 1 Applicatorful vaginally 2 (two) times daily. 10/05/21   Mayers, Cari S, PA-C  Norgestimate-Ethinyl Estradiol Triphasic (ORTHO TRI-CYCLEN LO) 0.18/0.215/0.25 MG-25 MCG tab Take 1 tablet by mouth daily. 01/30/21   Billie Ruddy, MD  valACYclovir (VALTREX) 1000 MG tablet Take 1 tablet (1,000 mg total) by mouth daily. 01/30/21   Billie Ruddy, MD  valACYclovir (VALTREX) 1000 MG tablet Take 1 tablet (1,000 mg total) by mouth daily. 10/29/21       Family History History reviewed. No pertinent family history.  Social History Social History   Tobacco Use   Smoking status: Never   Smokeless tobacco: Never  Vaping Use   Vaping Use: Never used  Substance Use Topics   Alcohol use: No   Drug use: No     Allergies   Patient has no known allergies.   Review of Systems Review of Systems Per HPI  Physical Exam Triage  Vital Signs ED Triage Vitals  Enc Vitals Group     BP 08/11/22 1710 137/78     Pulse Rate 08/11/22 1710 78     Resp 08/11/22 1710 20     Temp 08/11/22 1710 97.8 F (36.6 C)     Temp Source 08/11/22 1710 Oral     SpO2 08/11/22 1710 99 %     Weight --      Height --      Head Circumference --      Peak Flow --      Pain Score 08/11/22 1713 0     Pain Loc --      Pain Edu? --      Excl. in [place]? --    No data found.  Updated Vital Signs BP 137/78 (BP Location: Right Arm)   Pulse 78   Temp 97.8 F (36.6 C) (Oral)   Resp 20   LMP 08/03/2022 (Approximate)   SpO2 99%   Visual Acuity Right Eye Distance:   Left Eye Distance:   Bilateral Distance:    Right Eye Near:   Left Eye Near:    Bilateral Near:     Physical Exam Vitals and nursing note reviewed.  Constitutional:      Appearance: Normal appearance.   HENT:     Head: Normocephalic.     Mouth/Throat:     Mouth: Mucous membranes are moist.     Pharynx: Oropharynx is clear.     Comments: Airway is clear and patent, no obstruction noted. Eyes:     Extraocular Movements: Extraocular movements intact.     Pupils: Pupils are equal, round, and reactive to light.  Cardiovascular:     Rate and Rhythm: Normal rate and regular rhythm.     Pulses: Normal pulses.     Heart sounds: Normal heart sounds.  Pulmonary:     Effort: Pulmonary effort is normal.  Abdominal:     General: Bowel sounds are normal.     Palpations: Abdomen is soft.     Tenderness: There is no abdominal tenderness.  Musculoskeletal:     Cervical back: Normal range of motion.  Skin:    General: Skin is warm and dry.     Findings: Rash present. Rash is macular and papular.     Comments: Fine maculopapular rash noted to the patient's face.  There is no congruent pattern.  Patient with scant small "pimple" appearing lesions noted throughout.  There is no oozing, fluctuance, or drainage present.  Neurological:     General: No focal deficit present.     Mental Status: She is alert and oriented to person, place, and time.  Psychiatric:        Mood and Affect: Mood normal.        Behavior: Behavior normal.      UC Treatments / Results  Labs (all labs ordered are listed, but only abnormal results are displayed) Labs Reviewed - No data to display  EKG   Radiology No results found.  Procedures Procedures (including critical care time)  Medications Ordered in UC Medications  dexamethasone (DECADRON) injection 10 mg (10 mg Intramuscular Given 08/11/22 1732)    Initial Impression / Assessment and Plan / UC Course  I have reviewed the triage vital signs and the nursing notes.  Pertinent labs & imaging results that were available during my care of the patient were reviewed by me and considered in my medical decision making (see chart for details).  The patient  is  well-appearing, she is in no acute distress, vital signs are stable.  Presents for complaints of rash noted after using synthetic braiding hair.  Symptoms initially improved, but returned today.  On exam, patient has a fine maculopapular rash noted to her face with scant small "pimple appearing" lesions noted.  There is no oozing, fluctuance, or drainage present.  Symptoms appear to be consistent with contact dermatitis, but cannot determine if the irritant was the castor oil that she used earlier today.  Patient was administered Decadron 10 mg IM in the clinic for itching.  Patient advised to continue use of over-the-counter Zyrtec and Benadryl for itching.  Patient was given a prescription for famotidine 20 mg daily to take as an antihistamine.  Patient was given supportive care recommendations to include cleansing the face with warm water and unscented soap such as Dove, along with indications of when follow-up in the emergency department would be indicated.  Patient is in agreement with this plan of care and verbalizes understanding.  All questions were answered.  Patient stable for discharge.   Final Clinical Impressions(s) / UC Diagnoses   Final diagnoses:  Allergic reaction, initial encounter  Contact dermatitis, unspecified contact dermatitis type, unspecified trigger     Discharge Instructions      Take medication as prescribed. Take over-the-counter Zyrtec each morning, and Benadryl at bedtime for the next 5 to 7 days or until symptoms improve. Clean the face with warm water and a soap like Dove unscented soap. Recommend using moisturizer such as CeraVe, Eucerin, or Aquaphor, while symptoms persist. Avoid triggers that are causing your symptoms. If you develop tongue swelling, lip swelling, throat swelling, have difficulty breathing, please go to the emergency department for further evaluation. Follow-up as needed.     ED Prescriptions     Medication Sig Dispense Auth. Provider    famotidine (PEPCID) 20 MG tablet Take 1 tablet (20 mg total) by mouth daily for 15 days. 15 tablet Katalia Choma-Warren, Alda Lea, NP      PDMP not reviewed this encounter.   Tish Men, NP 08/11/22 1734

## 2022-09-07 ENCOUNTER — Ambulatory Visit (INDEPENDENT_AMBULATORY_CARE_PROVIDER_SITE_OTHER): Payer: No Typology Code available for payment source | Admitting: Clinical

## 2022-09-07 DIAGNOSIS — F419 Anxiety disorder, unspecified: Secondary | ICD-10-CM | POA: Diagnosis not present

## 2022-09-07 DIAGNOSIS — F331 Major depressive disorder, recurrent, moderate: Secondary | ICD-10-CM

## 2022-09-07 NOTE — Progress Notes (Signed)
Virtual Visit via Video Note   I connected with Sonia Gibson on 09/07/22 at  3:00 PM EST by a video enabled telemedicine application and verified that I am speaking with the correct person using two identifiers.   Location: Patient: Home Provider: Office   I discussed the limitations of evaluation and management by telemedicine and the availability of in person appointments. The patient expressed understanding and agreed to proceed.     THERAPIST PROGRESS NOTE   Session Time: 3:00 PM-3:30 PM   Participation Level: Active   Behavioral Response: CasualAlertDepressed   Type of Therapy: Individual Therapy   Treatment Goals addressed: Coping to assist with Depression and Anxiety   Interventions: CBT, DBT, Solution Focused, Strength-based and Supportive   Summary: Sonia Gibson is a 32 y.o. female who presents with Depression and Anxiety. The OPT therapist worked with the patient for her OPT treatment. The OPT therapist utilized Motivational Interviewing to assist in creating therapeutic repore. 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 The patient in this session spoke about willingness to continue to be self focused on her health and making healthy choices.The OPT therapist continued to support and encourage the patient utilize her coping including reading bible, journaling, and praying.The patient spoke about plans to go on vacation in May.   Suicidal/Homicidal:  Nowithout intent/plan   Therapist Response: The OPT therapist worked with the patient for the patients scheduled session. The patient was engaged in her session and gave feedback in relation to triggers, symptoms, and behavior responses over the past few weeks The patient continues to actively implement tools to improve her basic health need areas around sleep, eating habits, hygiene, and physical exercise with consistency.The OPT therapist worked with the patient utilizing an in session Cognitive Behavioral Therapy exercise. The patient was responsive in the session and verbalized, " I over the last 2 weeks have been feeling a lot of fatigue ". The patient spoke about her work to be consistent with her appetite and using boost drinks to help her with gaining weight.The OPT therapist worked with the patient overviewing ranges in her basic health areas to be mindful of to assist with energy level including diet, exercise, and sleep The patient spoke about potentially going on vacation to the beach in May. The OPT therapist will continue treatment work with the patient in her next scheduled session.      Plan: Return again in 2 weeks.   Diagnosis:      Axis I: Major depressive disorder, recurrent episode, moderate with anxious distress                            Axis II: No diagnosis   Collaboration of Care: No additional collaboration of care for this session.   Patient/Guardian was advised Release of Information must be obtained prior to any record release in order to collaborate their care with an outside provider. Patient/Guardian was advised if they have not already done so to contact the registration  department to sign all necessary forms in order for [place] to release information regarding their care.    Consent: Patient/Guardian gives verbal consent for treatment and assignment of benefits for services provided during this visit. Patient/Guardian expressed understanding and agreed to proceed.       I discussed the assessment and treatment plan with the patient. The patient was provided an opportunity to ask questions and all were answered. The patient agreed with the plan and demonstrated an understanding of the instructions.   The patient was advised to call back or seek an in-person evaluation if the symptoms worsen or if the condition fails to improve as anticipated.   I provided 30 minutes of non-face-to-face time during this encounter.   Sonia Burn, LCSW   09/07/2022

## 2022-10-06 ENCOUNTER — Encounter: Payer: Self-pay | Admitting: Emergency Medicine

## 2022-10-06 ENCOUNTER — Ambulatory Visit
Admission: EM | Admit: 2022-10-06 | Discharge: 2022-10-06 | Disposition: A | Discharge status: Home / Self Care | Payer: No Typology Code available for payment source | Attending: Nurse Practitioner | Admitting: Nurse Practitioner

## 2022-10-06 DIAGNOSIS — Z113 Encounter for screening for infections with a predominantly sexual mode of transmission: Secondary | ICD-10-CM | POA: Diagnosis present

## 2022-10-06 DIAGNOSIS — N898 Other specified noninflammatory disorders of vagina: Secondary | ICD-10-CM

## 2022-10-06 MED ORDER — METRONIDAZOLE 0.75 % EX GEL: 1.0000 | Freq: Every day | CUTANEOUS | 0 refills | Status: AC

## 2022-10-06 NOTE — ED Triage Notes (Signed)
White Vaginal discharge x 2 days

## 2022-10-06 NOTE — Discharge Instructions (Addendum)
Use medication as prescribed. We have collected a cytology swab which will check for BV, yeast, gonorrhea, trichomonas, and chlamydia.  You also have blood work pending for HIV and syphilis.  Your results should be available within the next 48 to 72 hours.  If you have access to MyChart, you will be able to see the results there.  If your results are positive, you will be contacted to discuss treatment. Increase condom use Follow-up as needed.

## 2022-10-06 NOTE — ED Provider Notes (Signed)
[facility]    CSN: [id number] Arrival date & time: 10/06/22  1743      History   Chief Complaint No chief complaint on file.   HPI Sonia Gibson is a 32 y.o. female.   The history is provided by the patient.   Patient presents for complaints of vaginal discharge that is been present for the past 2 days.  Patient also complains of vaginal odor.  Patient states that the odor smells similar to when she has BV.  Patient reports recurrent history of BV.  Patient denies vaginal itching, urinary frequency, urgency, hesitancy, hematuria, low back pain, or flank pain.  Patient reports history of HSV.  Chart reveals patient also has a history of trichomonas.  Patient reports 1 female partner in the past 90 days.  Last menstrual cycle was on 09/19/2022.  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 Authorizing Provider  metroNIDAZOLE (METROGEL) 0.75 % gel Apply 1 Application topically at bedtime for 7 days. 10/06/22 10/13/22 Yes Shiori Adcox-Warren, Sadie Haber, NP  cetirizine (ZYRTEC) 10 MG tablet Take 1 tablet (10 mg total) by mouth daily. 12/29/19   Deeann Saint, MD  valACYclovir (VALTREX) 1000 MG tablet Take 1 tablet (1,000 mg total) by mouth daily. 01/30/21   Deeann Saint, MD  valACYclovir (VALTREX) 1000 MG tablet Take 1 tablet (1,000 mg  total) by mouth daily. 10/29/21       Family History History reviewed. No pertinent family history.  Social History Social History   Tobacco Use   Smoking status: Never   Smokeless tobacco: Never  Vaping Use   Vaping Use: Never used  Substance Use Topics   Alcohol use: No   Drug use: No     Allergies   Patient has no known allergies.   Review of Systems Review of Systems Per HPI  Physical Exam Triage Vital Signs ED Triage Vitals  Enc Vitals Group     BP 10/06/22 1747 124/80     Pulse Rate 10/06/22 1747 84     Resp 10/06/22 1747 18     Temp 10/06/22 1747 98.7 F (37.1 C)     Temp Source 10/06/22 1747 Oral     SpO2 10/06/22 1747 98 %     Weight --      Height --      Head Circumference --      Peak Flow --      Pain Score 10/06/22 1748 0     Pain Loc --      Pain Edu? --  Excl. in [place]? --    No data found.  Updated Vital Signs BP 124/80 (BP Location: Right Arm)   Pulse 84   Temp 98.7 F (37.1 C) (Oral)   Resp 18   LMP 09/19/2022 (Exact Date)   SpO2 98%   Visual Acuity Right Eye Distance:   Left Eye Distance:   Bilateral Distance:    Right Eye Near:   Left Eye Near:    Bilateral Near:     Physical Exam Vitals and nursing note reviewed.  Constitutional:      General: She is not in acute distress.    Appearance: Normal appearance.  HENT:     Head: Normocephalic.  Eyes:     Extraocular Movements: Extraocular movements intact.     Pupils: Pupils are equal, round, and reactive to light.  Cardiovascular:     Rate and Rhythm: Normal rate and regular rhythm.     Pulses: Normal pulses.     Heart sounds: Normal heart sounds.  Pulmonary:     Effort: Pulmonary effort is normal.     Breath sounds: Normal breath sounds.  Abdominal:     General: Bowel sounds are normal.     Palpations: Abdomen is soft.     Tenderness: There is no abdominal tenderness.  Genitourinary:    Comments: GU exam deferred, self swab performed  Musculoskeletal:      Cervical back: Normal range of motion.  Skin:    General: Skin is warm and dry.  Neurological:     General: No focal deficit present.     Mental Status: She is alert and oriented to person, place, and time.  Psychiatric:        Mood and Affect: Mood normal.        Behavior: Behavior normal.      UC Treatments / Results  Labs (all labs ordered are listed, but only abnormal results are displayed) Labs Reviewed  RPR  HIV ANTIBODY (ROUTINE TESTING W REFLEX)  CERVICOVAGINAL ANCILLARY ONLY    EKG   Radiology No results found.  Procedures Procedures (including critical care time)  Medications Ordered in UC Medications - No data to display  Initial Impression / Assessment and Plan / UC Course  I have reviewed the triage vital signs and the nursing notes.  Pertinent labs & imaging results that were available during my care of the patient were reviewed by me and considered in my medical decision making (see chart for details).  Cytology swab, HIV, and RPR test are pending.  Patient with vaginal discharge, and vaginal odor, patient reports similar to previous episodes of vaginosis.  Will treat empirically with metronidazole gel 0.75%.  Patient advised she will be contacted if the pending test results are positive, she will also be advised if the results are negative, and she needs to stop the antibiotic.  Patient advised to increase condom use with each sexual encounter.  Patient is in agreement with this plan of care and verbalizes understanding.  All questions were answered.  Patient stable for discharge.  Final Clinical Impressions(s) / UC Diagnoses   Final diagnoses:  Screening examination for sexually transmitted disease  Vaginal discharge  Vaginal odor     Discharge Instructions      Use medication as prescribed. We have collected a cytology swab which will check for BV, yeast, gonorrhea, trichomonas, and chlamydia.  You also have blood work pending for HIV and  syphilis.  Your results should be available within the next 48 to  72 hours.  If you have access to MyChart, you will be able to see the results there.  If your results are positive, you will be contacted to discuss treatment. Increase condom use Follow-up as needed.      ED Prescriptions     Medication Sig Dispense Auth. Provider   metroNIDAZOLE (METROGEL) 0.75 % gel Apply 1 Application topically at bedtime for 7 days. 45 g Jillian Warth-Warren, Sadie Haber, NP      PDMP not reviewed this encounter.   Abran Cantor, NP 10/06/22 1807

## 2022-10-07 LAB — CERVICOVAGINAL ANCILLARY ONLY
Bacterial Vaginitis (gardnerella): NEGATIVE
Candida Glabrata: NEGATIVE
Candida Vaginitis: NEGATIVE
Chlamydia: NEGATIVE
Comment: NEGATIVE
Comment: NEGATIVE
Comment: NEGATIVE
Comment: NEGATIVE
Comment: NEGATIVE
Comment: NORMAL
Neisseria Gonorrhea: NEGATIVE
Trichomonas: NEGATIVE

## 2022-10-07 LAB — RPR: RPR Ser Ql: NONREACTIVE

## 2022-10-07 LAB — HIV ANTIBODY (ROUTINE TESTING W REFLEX): HIV Screen 4th Generation wRfx: NONREACTIVE

## 2022-10-13 ENCOUNTER — Ambulatory Visit (HOSPITAL_COMMUNITY): Payer: No Typology Code available for payment source | Admitting: Clinical

## 2022-10-22 ENCOUNTER — Other Ambulatory Visit (HOSPITAL_COMMUNITY): Payer: Self-pay

## 2022-12-02 ENCOUNTER — Ambulatory Visit
Admission: EM | Admit: 2022-12-02 | Discharge: 2022-12-02 | Disposition: A | Discharge status: Home / Self Care | Payer: No Typology Code available for payment source | Attending: Family Medicine | Admitting: Family Medicine

## 2022-12-02 ENCOUNTER — Telehealth: Payer: No Typology Code available for payment source | Admitting: Physician Assistant

## 2022-12-02 ENCOUNTER — Encounter: Payer: Self-pay | Admitting: Emergency Medicine

## 2022-12-02 DIAGNOSIS — J301 Allergic rhinitis due to pollen: Secondary | ICD-10-CM

## 2022-12-02 DIAGNOSIS — J0100 Acute maxillary sinusitis, unspecified: Secondary | ICD-10-CM

## 2022-12-02 MED ORDER — OLOPATADINE HCL 0.1 % OP SOLN: 1.0000 [drp] | Freq: Two times a day (BID) | OPHTHALMIC | 0 refills | Status: AC

## 2022-12-02 MED ORDER — AZELASTINE HCL 0.1 % NA SOLN: 1.0000 | Freq: Two times a day (BID) | NASAL | 0 refills | Status: DC

## 2022-12-02 MED ORDER — AMOXICILLIN-POT CLAVULANATE 875-125 MG PO TABS: 1.0000 | ORAL_TABLET | Freq: Two times a day (BID) | ORAL | 0 refills | Status: DC

## 2022-12-02 NOTE — ED Triage Notes (Signed)
Nasal congestion, headache, some bloody nasal drainage x 6 days

## 2022-12-02 NOTE — ED Provider Notes (Signed)
[facility]    CSN: [id number] Arrival date & time: 12/02/22  1750      History   Chief Complaint No chief complaint on file.   HPI Sonia Gibson is a 32 y.o. female.   Patient presenting today with about a week of progressively worsening nasal congestion, headache, cough, sinus pain and pressure and bloody nasal discharge.  Denies fever, chills, chest pain, shortness of breath, abdominal pain, nausea vomiting or diarrhea.  So far trying Zyrtec daily for seasonal allergies with no benefit.  No known sick contacts recently.    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 Authorizing Provider  amoxicillin-clavulanate (AUGMENTIN) 875-125 MG tablet Take 1 tablet by mouth every 12 (twelve) hours. 12/02/22  Yes Particia Nearing, PA-C  cetirizine (ZYRTEC) 10 MG tablet Take 1 tablet (10 mg total) by mouth daily. 12/29/19   Deeann Saint, MD  olopatadine (PATANOL) 0.1 % ophthalmic solution Place 1 drop into both eyes 2 (two) times daily. 12/02/22   Margaretann Loveless, PA-C  valACYclovir (VALTREX) 1000 MG tablet Take 1 tablet (1,000 mg total) by mouth daily. 01/30/21   Deeann Saint, MD  valACYclovir (VALTREX) 1000 MG tablet Take 1 tablet (1,000 mg total) by mouth daily. 10/29/21       Family History History reviewed. No  pertinent family history.  Social History Social History   Tobacco Use   Smoking status: Never   Smokeless tobacco: Never  Vaping Use   Vaping status: Never Used  Substance Use Topics   Alcohol use: No   Drug use: No     Allergies   Patient has no known allergies.   Review of Systems Review of Systems Per HPI  Physical Exam Triage Vital Signs ED Triage Vitals  Encounter Vitals Group     BP 12/02/22 1754 (!) 128/90     Systolic BP Percentile --      Diastolic BP Percentile --      Pulse Rate 12/02/22 1754 82     Resp 12/02/22 1754 16     Temp 12/02/22 1754 98.9 F (37.2 C)     Temp Source 12/02/22 1754 Oral     SpO2 12/02/22 1754 98 %     Weight --      Height --      Head Circumference --      Peak Flow --      Pain Score 12/02/22 1755 6     Pain Loc --      Pain Education --  Exclude from Growth Chart --    No data found.  Updated Vital Signs BP (!) 128/90 (BP Location: Right Arm)   Pulse 82   Temp 98.9 F (37.2 C) (Oral)   Resp 16   LMP 11/12/2022 (Exact Date)   SpO2 98%   Visual Acuity Right Eye Distance:   Left Eye Distance:   Bilateral Distance:    Right Eye Near:   Left Eye Near:    Bilateral Near:     Physical Exam Vitals and nursing note reviewed.  Constitutional:      Appearance: Normal appearance. She is not ill-appearing.  HENT:     Head: Atraumatic.     Nose: Congestion present.     Mouth/Throat:     Mouth: Mucous membranes are moist.     Pharynx: Oropharynx is clear. Posterior oropharyngeal erythema present.  Eyes:     Extraocular Movements: Extraocular movements intact.     Conjunctiva/sclera: Conjunctivae normal.  Cardiovascular:     Rate and Rhythm: Normal rate and regular rhythm.     Heart sounds: Normal heart sounds.  Pulmonary:     Effort: Pulmonary effort is normal.     Breath sounds: Normal breath sounds.  Musculoskeletal:        General: Normal range of motion.     Cervical back: Normal range of motion  and neck supple.  Skin:    General: Skin is warm and dry.  Neurological:     Mental Status: She is alert and oriented to person, place, and time.     Motor: No weakness.     Gait: Gait normal.  Psychiatric:        Mood and Affect: Mood normal.        Thought Content: Thought content normal.        Judgment: Judgment normal.      UC Treatments / Results  Labs (all labs ordered are listed, but only abnormal results are displayed) Labs Reviewed - No data to display  EKG   Radiology No results found.  Procedures Procedures (including critical care time)  Medications Ordered in UC Medications - No data to display  Initial Impression / Assessment and Plan / UC Course  I have reviewed the triage vital signs and the nursing notes.  Pertinent labs & imaging results that were available during my care of the patient were reviewed by me and considered in my medical decision making (see chart for details).     Given duration worsening course, treat with Augmentin, Flonase, Zyrtec, DayQuil, NyQuil and supportive home care.  Return for worsening symptoms.  Final Clinical Impressions(s) / UC Diagnoses   Final diagnoses:  Acute non-recurrent maxillary sinusitis     Discharge Instructions      Zyrtec and Flonase daily.  Take the course of antibiotics sent to the pharmacy.  DayQuil, NyQuil, sinus rinses additionally as needed    ED Prescriptions     Medication Sig Dispense Auth. Provider   amoxicillin-clavulanate (AUGMENTIN) 875-125 MG tablet Take 1 tablet by mouth every 12 (twelve) hours. 14 tablet Particia Nearing, [place]      PDMP not reviewed this encounter.   Particia Nearing, [place] 12/02/22 [phone]

## 2022-12-02 NOTE — Discharge Instructions (Signed)
Zyrtec and Flonase daily.  Take the course of antibiotics sent to the pharmacy.  DayQuil, NyQuil, sinus rinses additionally as needed

## 2022-12-02 NOTE — Progress Notes (Signed)
E visit for Allergic Rhinitis We are sorry that you are not feeling well.  Here is how we plan to help!  Based on what you have shared with me it looks like you have Allergic Rhinitis.  Rhinitis is when a reaction occurs that causes nasal congestion, runny nose, sneezing, and itching.  Most types of rhinitis are caused by an inflammation and are associated with symptoms in the eyes ears or throat. There are several types of rhinitis.  The most common are acute rhinitis, which is usually caused by a viral illness, allergic or seasonal rhinitis, and nonallergic or year-round rhinitis.  Nasal allergies occur certain times of the year.  Allergic rhinitis is caused when allergens in the air trigger the release of histamine in the body.  Histamine causes itching, swelling, and fluid to build up in the fragile linings of the nasal passages, sinuses and eyelids.  An itchy nose and clear discharge are common.  I recommend the following over the counter treatments: You should take a daily dose of antihistamine; Can continue Zyrtec 10mg  daily or can change to Allegra (fexofenadine ) 180mg  daily  I also would recommend a nasal spray: I have prescribed Azelastine nasal spray Spray 1 spray in each nostril twice daily  You may also benefit from eye drops such as: I have prescribed Olopatadine 0.1% eye drops. Place 1 drop in each eye twice daily as needed for itchy, watery eyes  HOME CARE:  You can use an over-the-counter saline nasal spray as needed Avoid areas where there is heavy dust, mites, or molds Stay indoors on windy days during the pollen season Keep windows closed in home, at least in bedroom; use air conditioner. Use high-efficiency house air filter Keep windows closed in car, turn AC on re-circulate Avoid playing out with dog during pollen season  GET HELP RIGHT AWAY IF:  If your symptoms do not improve within 10 days You become short of breath You develop yellow or green discharge from  your nose for over 3 days You have coughing fits  MAKE SURE YOU:  Understand these instructions Will watch your condition Will get help right away if you are not doing well or get worse  Thank you for choosing an e-visit. Your e-visit answers were reviewed by a board certified advanced clinical practitioner to complete your personal care plan. Depending upon the condition, your plan could have included both over the counter or prescription medications. Please review your pharmacy choice. Be sure that the pharmacy you have chosen is open so that you can pick up your prescription now.  If there is a problem you may message your provider in MyChart to have the prescription routed to another pharmacy. Your safety is important to [place]. If you have drug allergies check your prescription carefully.  For the next 24 hours, you can use MyChart to ask questions about today's visit, request a non-urgent call back, or ask for a work or school excuse from your e-visit provider. You will get an email in the next two days asking about your experience. I hope that your e-visit has been valuable and will speed your recovery.       I have spent 5 minutes in review of e-visit questionnaire, review and updating patient chart, medical decision making and response to patient.   Margaretann Loveless, PA-C

## 2022-12-21 ENCOUNTER — Telehealth: Payer: No Typology Code available for payment source | Admitting: Nurse Practitioner

## 2022-12-21 DIAGNOSIS — N760 Acute vaginitis: Secondary | ICD-10-CM | POA: Diagnosis not present

## 2022-12-21 DIAGNOSIS — B9689 Other specified bacterial agents as the cause of diseases classified elsewhere: Secondary | ICD-10-CM | POA: Diagnosis not present

## 2022-12-21 MED ORDER — METRONIDAZOLE 0.75 % VA GEL: 1.0000 | Freq: Every day | VAGINAL | 0 refills | Status: AC

## 2022-12-21 NOTE — Progress Notes (Signed)
E-Visit for Vaginal Symptoms  We are sorry that you are not feeling well. Here is how we plan to help! Based on what you shared with me it looks like you: May have a vaginosis due to bacteria  Vaginosis is an inflammation of the vagina that can result in discharge, itching and pain. The cause is usually a change in the normal balance of vaginal bacteria or an infection. Vaginosis can also result from reduced estrogen levels after menopause.  The most common causes of vaginosis are:   Bacterial vaginosis which results from an overgrowth of one on several organisms that are normally present in your vagina.   Yeast infections which are caused by a naturally occurring fungus called candida.   Vaginal atrophy (atrophic vaginosis) which results from the thinning of the vagina from reduced estrogen levels after menopause.   Trichomoniasis which is caused by a parasite and is commonly transmitted by sexual intercourse.  Factors that increase your risk of developing vaginosis include: Medications, such as antibiotics and steroids Uncontrolled diabetes Use of hygiene products such as bubble bath, vaginal spray or vaginal deodorant Douching Wearing damp or tight-fitting clothing Using an intrauterine device (IUD) for birth control Hormonal changes, such as those associated with pregnancy, birth control pills or menopause Sexual activity Having a sexually transmitted infection  Your treatment plan is : Meds ordered this encounter  Medications   metroNIDAZOLE (METROGEL) 0.75 % vaginal gel    Sig: Place 1 Applicatorful vaginally at bedtime for 7 days.    Dispense:  70 g    Refill:  0     Be sure to take all of the medication as directed. Stop taking any medication if you develop a rash, tongue swelling or shortness of breath. Mothers who are breast feeding should consider pumping and discarding their breast milk while on these antibiotics. However, there is no consensus that infant exposure at  these doses would be harmful.  Remember that medication creams can weaken latex condoms. Marland Kitchen   HOME CARE:  Good hygiene may prevent some types of vaginosis from recurring and may relieve some symptoms:  Avoid baths, hot tubs and whirlpool spas. Rinse soap from your outer genital area after a shower, and dry the area well to prevent irritation. Don't use scented or harsh soaps, such as those with deodorant or antibacterial action. Avoid irritants. These include scented tampons and pads. Wipe from front to back after using the toilet. Doing so avoids spreading fecal bacteria to your vagina.  Other things that may help prevent vaginosis include:  Don't douche. Your vagina doesn't require cleansing other than normal bathing. Repetitive douching disrupts the normal organisms that reside in the vagina and can actually increase your risk of vaginal infection. Douching won't clear up a vaginal infection. Use a latex condom. Both female and female latex condoms may help you avoid infections spread by sexual contact. Wear cotton underwear. Also wear pantyhose with a cotton crotch. If you feel comfortable without it, skip wearing underwear to bed. Yeast thrives in [employer] Your symptoms should improve in the next day or two.  GET HELP RIGHT AWAY IF:  You have pain in your lower abdomen ( pelvic area or over your ovaries) You develop nausea or vomiting You develop a fever Your discharge changes or worsens You have persistent pain with intercourse You develop shortness of breath, a rapid pulse, or you faint.  These symptoms could be signs of problems or infections that need to be evaluated by a  medical provider now.  MAKE SURE YOU   Understand these instructions. Will watch your condition. Will get help right away if you are not doing well or get worse.  Thank you for choosing an e-visit.  Your e-visit answers were reviewed by a board certified advanced clinical practitioner to  complete your personal care plan. Depending upon the condition, your plan could have included both over the counter or prescription medications.  Please review your pharmacy choice. Make sure the pharmacy is open so you can pick up prescription now. If there is a problem, you may contact your provider through [employer] and have the prescription routed to another pharmacy.  Your safety is important to [place]. If you have drug allergies check your prescription carefully.   For the next 24 hours you can use MyChart to ask questions about today's visit, request a non-urgent call back, or ask for a work or school excuse. You will get an email in the next two days asking about your experience. I hope that your e-visit has been valuable and will speed your recovery.   I spent approximately 5 minutes reviewing the patient's history, current symptoms and coordinating their care today.

## 2023-02-18 ENCOUNTER — Ambulatory Visit (INDEPENDENT_AMBULATORY_CARE_PROVIDER_SITE_OTHER): Payer: No Typology Code available for payment source | Admitting: Clinical

## 2023-02-18 DIAGNOSIS — F331 Major depressive disorder, recurrent, moderate: Secondary | ICD-10-CM | POA: Diagnosis not present

## 2023-02-18 DIAGNOSIS — F419 Anxiety disorder, unspecified: Secondary | ICD-10-CM
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
## 2023-03-17 ENCOUNTER — Other Ambulatory Visit: Payer: Self-pay

## 2023-03-17 ENCOUNTER — Ambulatory Visit: Payer: No Typology Code available for payment source

## 2023-03-17 ENCOUNTER — Ambulatory Visit
Admission: EM | Admit: 2023-03-17 | Discharge: 2023-03-17 | Disposition: A | Discharge status: Home / Self Care | Payer: No Typology Code available for payment source | Attending: Nurse Practitioner | Admitting: Nurse Practitioner

## 2023-03-17 ENCOUNTER — Encounter: Payer: Self-pay | Admitting: Emergency Medicine

## 2023-03-17 DIAGNOSIS — R002 Palpitations: Secondary | ICD-10-CM | POA: Diagnosis not present

## 2023-03-17 NOTE — ED Triage Notes (Addendum)
Pt reports "fluttering" sensation in chest intermittently with increased frequency since Sunday. Pt denies any pain or shortness of breath with episodes. Denies "fluttering" sensation at this time.

## 2023-03-17 NOTE — ED Provider Notes (Signed)
[facility]    CSN: [id number] Arrival date & time: 03/17/23  1734      History   Chief Complaint Chief Complaint  Patient presents with   Palpitations    HPI Sonia Gibson is a 32 y.o. female.   The history is provided by the patient.   Patient presents for complaints of palpitation that started approximately 4 days ago.  Patient states that since her symptoms started, she has had 2 episodes.  She states her last episode was today.  She describes her episodes as feeling like there is a "gas bubble" or "fluttering" on the left side of her chest.  She states the episodes last approximately 10 to 15 minutes.  She denies chest pain, shortness of breath, difficulty breathing, cough, nausea, or vomiting when the episodes occur.  Patient states when the episodes have occurred in the past, she has been at rest.  She denies prior history of cardiac disease.  Patient states that she does not smoke.  She also denies use of alcohol.  Patient reports that she does take ashwaganda, slippery elm bark, and macaroot occasionally as dietary supplements.  Patient reports that she does drink caffeine.  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 Authorizing Provider  amoxicillin-clavulanate (AUGMENTIN) 875-125 MG tablet Take 1 tablet by mouth every  12 (twelve) hours. 12/02/22   Particia Nearing, PA-C  cetirizine (ZYRTEC) 10 MG tablet Take 1 tablet (10 mg total) by mouth daily. 12/29/19   Deeann Saint, MD  olopatadine (PATANOL) 0.1 % ophthalmic solution Place 1 drop into both eyes 2 (two) times daily. 12/02/22   Margaretann Loveless, PA-C  valACYclovir (VALTREX) 1000 MG tablet Take 1 tablet (1,000 mg total) by mouth daily. 01/30/21   Deeann Saint, MD  valACYclovir (VALTREX) 1000 MG tablet Take 1 tablet (1,000 mg total) by mouth daily. 10/29/21       Family History History reviewed. No pertinent family history.  Social History Social History   Tobacco Use   Smoking status: Never   Smokeless tobacco: Never  Vaping Use   Vaping status: Never Used  Substance Use Topics   Alcohol use: No   Drug use: No     Allergies   Patient has no known allergies.   Review of Systems Review of Systems Per HPI  Physical Exam Triage Vital Signs ED Triage Vitals  Encounter Vitals Group     BP 03/17/23 1741 124/79     Systolic BP Percentile --      Diastolic BP Percentile --  Pulse Rate 03/17/23 1741 75     Resp 03/17/23 1741 20     Temp 03/17/23 1741 98.3 F (36.8 C)     Temp Source 03/17/23 1741 Oral     SpO2 03/17/23 1741 98 %     Weight --      Height --      Head Circumference --      Peak Flow --      Pain Score 03/17/23 1742 0     Pain Loc --      Pain Education --      Exclude from Growth Chart --    No data found.  Updated Vital Signs BP 124/79 (BP Location: Right Arm)   Pulse 75   Temp 98.3 F (36.8 C) (Oral)   Resp 20   LMP 02/21/2023 (Approximate)   SpO2 98%   Visual Acuity Right Eye Distance:   Left Eye Distance:   Bilateral Distance:    Right Eye Near:   Left Eye Near:    Bilateral Near:     Physical Exam Vitals and nursing note reviewed.  Constitutional:      General: She is not in acute distress.    Appearance: Normal appearance.  HENT:     Head: Normocephalic.  Eyes:      Extraocular Movements: Extraocular movements intact.     Pupils: Pupils are equal, round, and reactive to light.  Cardiovascular:     Rate and Rhythm: Normal rate and regular rhythm.     Pulses: Normal pulses.     Heart sounds: Normal heart sounds.  Pulmonary:     Effort: Pulmonary effort is normal. No respiratory distress.     Breath sounds: Normal breath sounds. No stridor. No wheezing, rhonchi or rales.  Abdominal:     General: Bowel sounds are normal.     Palpations: Abdomen is soft.     Tenderness: There is no abdominal tenderness.  Musculoskeletal:     Cervical back: Normal range of motion.  Lymphadenopathy:     Cervical: No cervical adenopathy.  Skin:    General: Skin is warm and dry.  Neurological:     General: No focal deficit present.     Mental Status: She is alert and oriented to person, place, and time.  Psychiatric:        Mood and Affect: Mood normal.        Behavior: Behavior normal.      UC Treatments / Results  Labs (all labs ordered are listed, but only abnormal results are displayed) Labs Reviewed  BASIC METABOLIC PANEL  CBC WITH DIFFERENTIAL/PLATELET    EKG: Accelerated junctional rhythm, no STEMI.  No other comparisons available.   Radiology DG Chest 2 View  Result Date: 03/17/2023 CLINICAL DATA:  fluttering sensation in chest since sunday EXAM: CHEST - 2 VIEW COMPARISON:  None Available. FINDINGS: The cardiomediastinal contours are normal. The lungs are clear. Pulmonary vasculature is normal. No consolidation, pleural effusion, or pneumothorax. Slight scoliotic curvature. No acute osseous abnormalities are seen. IMPRESSION: No active cardiopulmonary disease. Electronically Signed   By: Narda Rutherford M.D.   On: 03/17/2023 18:23    Procedures Procedures (including critical care time)  Medications Ordered in UC Medications - No data to display  Initial Impression / Assessment and Plan / UC Course  I have reviewed the triage vital signs and  the nursing notes.  Pertinent labs & imaging results that were available during my care of the patient were reviewed by  me and considered in my medical decision making (see chart for details).  CBC and BMP pending.  Would like to evaluate patient's electrolytes to rule out electrolyte imbalance for potential causes of patient's symptoms.  Chest x-ray does not show any active cardiopulmonary disease.  Difficult to ascertain the cause of the patient's symptoms at this time.  In the interim, patient was given supportive care recommendations to include avoiding stimulants such as caffeine, antihistamines, and to refrain from the supplements she is currently taking.  Patient was advised she can engage in light physical activity such as light walking.  Would like for the patient to follow-up with her PCP as soon as possible for further evaluation and to discuss referral to cardiology.  Patient was given strict ER follow-up precautions.  Patient is in agreement with this plan of care and verbalizes understanding.  All questions were answered.  Patient is stable for discharge.  Final Clinical Impressions(s) / UC Diagnoses   Final diagnoses:  Palpitations     Discharge Instructions      Lab work is pending.  You will be contacted if the pending test results are abnormal.  You will also have access to the results via MyChart. Avoid caffeine, antihistamines, alcohol, nicotine, or recreational drugs.  Also recommend discontinuing use of the ashwaganda, slippery elm bark, and macaroot until you have been seen by your PCP or cardiology. You may engage in light physical activity such as walking. If you develop more frequent symptoms, with new symptoms of difficulty breathing, shortness of breath, or chest pain, please go to the emergency department immediately for further evaluation. Would like for you to follow-up with your primary care physician as soon as possible for further evaluation.  Please discuss  evaluation by cardiology. Follow-up as needed.     ED Prescriptions   None    PDMP not reviewed this encounter.   Abran Cantor, NP 03/17/23 1843

## 2023-03-17 NOTE — Discharge Instructions (Addendum)
Lab work is pending.  You will be contacted if the pending test results are abnormal.  You will also have access to the results via MyChart. Avoid caffeine, antihistamines, alcohol, nicotine, or recreational drugs.  Also recommend discontinuing use of the ashwaganda, slippery elm bark, and macaroot until you have been seen by your PCP or cardiology. You may engage in light physical activity such as walking. If you develop more frequent symptoms, with new symptoms of difficulty breathing, shortness of breath, or chest pain, please go to the emergency department immediately for further evaluation. Would like for you to follow-up with your primary care physician as soon as possible for further evaluation.  Please discuss evaluation by cardiology. Follow-up as needed.

## 2023-03-18 LAB — BASIC METABOLIC PANEL
BUN/Creatinine Ratio: 10 (ref 9–23)
BUN: 8 mg/dL (ref 6–20)
CO2: 21 mmol/L (ref 20–29)
Calcium: 9.5 mg/dL (ref 8.7–10.2)
Chloride: 106 mmol/L (ref 96–106)
Creatinine, Ser: 0.83 mg/dL (ref 0.57–1.00)
Glucose: 89 mg/dL (ref 70–99)
Potassium: 4 mmol/L (ref 3.5–5.2)
Sodium: 141 mmol/L (ref 134–144)
eGFR: 95 mL/min/{1.73_m2} (ref >59)

## 2023-03-18 LAB — CBC WITH DIFFERENTIAL/PLATELET
Basophils Absolute: 0.1 10*3/uL (ref 0.0–0.2)
Basos: 1 %
EOS (ABSOLUTE): 0.1 10*3/uL (ref 0.0–0.4)
Eos: 2 %
Hematocrit: 38.6 % (ref 34.0–46.6)
Hemoglobin: 12.5 g/dL (ref 11.1–15.9)
Immature Grans (Abs): 0 10*3/uL (ref 0.0–0.1)
Immature Granulocytes: 0 %
Lymphocytes Absolute: 2.2 10*3/uL (ref 0.7–3.1)
Lymphs: 36 %
MCH: 29.4 pg (ref 26.6–33.0)
MCHC: 32.4 g/dL (ref 31.5–35.7)
MCV: 91 fL (ref 79–97)
Monocytes Absolute: 0.3 10*3/uL (ref 0.1–0.9)
Monocytes: 6 %
Neutrophils Absolute: 3.4 10*3/uL (ref 1.4–7.0)
Neutrophils: 55 %
Platelets: 219 10*3/uL (ref 150–450)
RBC: 4.25 x10E6/uL (ref 3.77–5.28)
RDW: 12.9 % (ref 11.7–15.4)
WBC: 6.1 10*3/uL (ref 3.4–10.8)

## 2023-03-22 ENCOUNTER — Ambulatory Visit (HOSPITAL_COMMUNITY): Payer: No Typology Code available for payment source | Admitting: Clinical

## 2023-04-13 ENCOUNTER — Telehealth (HOSPITAL_COMMUNITY): Payer: Self-pay

## 2023-04-13 NOTE — Telephone Encounter (Signed)
Pt called in requesting a generic letter stating that she is seeing him for counseling. Please advise.

## 2023-04-13 NOTE — Telephone Encounter (Signed)
Spoke with pt she will wait until you come in office and will come pick up when completed

## 2023-06-18 ENCOUNTER — Other Ambulatory Visit (HOSPITAL_COMMUNITY): Payer: Self-pay

## 2023-06-18 MED ORDER — VALACYCLOVIR HCL 1 G PO TABS
1000.0000 mg | ORAL_TABLET | Freq: Every day | ORAL | 0 refills | Status: DC
  Filled 2023-06-18: qty 30, 30d supply, fill #0
  Filled 2023-08-12: qty 30, 30d supply, fill #1

## 2023-08-12 ENCOUNTER — Telehealth: Admitting: Physician Assistant

## 2023-08-12 ENCOUNTER — Other Ambulatory Visit (HOSPITAL_COMMUNITY): Payer: Self-pay

## 2023-08-12 DIAGNOSIS — L739 Follicular disorder, unspecified: Secondary | ICD-10-CM | POA: Diagnosis not present

## 2023-08-12 MED ORDER — CEPHALEXIN 500 MG PO CAPS
500.0000 mg | ORAL_CAPSULE | Freq: Four times a day (QID) | ORAL | 0 refills | Status: AC
  Filled 2023-08-12: qty 20, 5d supply, fill #0

## 2023-08-12 NOTE — Progress Notes (Signed)
 I have spent 5 minutes in review of e-visit questionnaire, review and updating patient chart, medical decision making and response to patient.   Piedad Climes, PA-C

## 2023-08-12 NOTE — Progress Notes (Signed)
 E-Visit for Cellulitis  We are sorry that you are not feeling well. Here is how we plan to help!  Based on what you shared with me it looks like you have cellulitis/folliculitis.  Cellulitis looks like areas of skin redness, swelling, and warmth; it develops as a result of bacteria entering under the skin. Little red spots and/or bleeding can be seen in skin, and tiny surface sacs containing fluid can occur. Fever can be present.   I have prescribed:  Keflex 500mg  take one by mouth four times a day for 5 days  HOME CARE:  Take your medications as ordered and take all of them, even if the skin irritation appears to be healing.   GET HELP RIGHT AWAY IF:  Symptoms that don't begin to go away within 48 hours. Severe redness persists or worsens If the area turns color, spreads or swells. If it blisters and opens, develops yellow-brown crust or bleeds. You develop a fever or chills. If the pain increases or becomes unbearable.  Are unable to keep fluids and food down.  MAKE SURE YOU   Understand these instructions. Will watch your condition. Will get help right away if you are not doing well or get worse.  Thank you for choosing an e-visit.  Your e-visit answers were reviewed by a board certified advanced clinical practitioner to complete your personal care plan. Depending upon the condition, your plan could have included both over the counter or prescription medications.  Please review your pharmacy choice. Make sure the pharmacy is open so you can pick up prescription now. If there is a problem, you may contact your provider through [employer] and have the prescription routed to another pharmacy.  Your safety is important to [place]. If you have drug allergies check your prescription carefully.   For the next 24 hours you can use MyChart to ask questions about today's visit, request a non-urgent call back, or ask for a work or school excuse. You will get an email in the next two days  asking about your experience. I hope that your e-visit has been valuable and will speed your recovery.

## 2023-09-20 ENCOUNTER — Telehealth: Admitting: Physician Assistant

## 2023-09-20 DIAGNOSIS — B9689 Other specified bacterial agents as the cause of diseases classified elsewhere: Secondary | ICD-10-CM

## 2023-09-20 DIAGNOSIS — N760 Acute vaginitis: Secondary | ICD-10-CM

## 2023-09-20 MED ORDER — METRONIDAZOLE 500 MG PO TABS: 500.0000 mg | ORAL_TABLET | Freq: Two times a day (BID) | ORAL | 0 refills | Status: AC

## 2023-09-20 MED ORDER — NUVESSA 1.3 % VA GEL: 1.0000 | Freq: Once | VAGINAL | 0 refills | Status: AC

## 2023-09-20 NOTE — Addendum Note (Signed)
 Addended by: Angelia Kelp on: 09/20/2023 11:44 AM   Modules accepted: Orders

## 2023-09-20 NOTE — Progress Notes (Signed)

## 2023-09-24 ENCOUNTER — Ambulatory Visit (INDEPENDENT_AMBULATORY_CARE_PROVIDER_SITE_OTHER): Admitting: Clinical

## 2023-09-24 DIAGNOSIS — F331 Major depressive disorder, recurrent, moderate: Secondary | ICD-10-CM | POA: Diagnosis not present

## 2023-09-24 DIAGNOSIS — F419 Anxiety disorder, unspecified: Secondary | ICD-10-CM | POA: Diagnosis not present

## 2023-09-24 NOTE — Progress Notes (Signed)
 Virtual Visit via Video Note   I connected with Sonia Gibson on 09/24/23 at  8:00 AM EST by a video enabled telemedicine application and verified that I am speaking with the correct person using two identifiers.   Location: Patient: Home Provider: Office   I discussed the limitations of evaluation and management by telemedicine and the availability of in person appointments. The patient expressed understanding and agreed to proceed.     THERAPIST PROGRESS NOTE   Session Time: 8:00 AM-8:30 AM   Participation Level: Active   Behavioral Response: CasualAlertDepressed/Anxious   Type of Therapy: Individual Therapy   Treatment Goals addressed: Coping to assist with Depression and Anxiety   Interventions: CBT, DBT, Solution Focused, Strength-based and Supportive   Summary: Sonia Gibson is a 33 y.o. female who presents with Depression and Anxiety. The OPT therapist worked with the patient for her OPT treatment. The OPT therapist utilized Motivational Interviewing to assist in creating therapeutic repore. 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 The patient in this session spoke about willingness to continue to be self focused on her health and making healthy choices.The OPT therapist continued to support and encourage the patient utilize her coping including reading bible, journaling, and praying.The patient spoke about plans to go on vacation in May.   Suicidal/Homicidal: Nowithout intent/plan   Therapist Response: The OPT therapist worked with the patient for the patients scheduled session. The patient  was engaged in her session and gave feedback in relation to triggers, symptoms, and behavior responses over the past few weeks The patient continues to actively implement tools to improve her basic health need areas around sleep, eating habits, hygiene, and physical exercise with consistency.The OPT therapist worked with the patient utilizing an in session Cognitive Behavioral Therapy exercise. The patient was responsive in the session and verbalized, " Back in January they cut our workforce and so now there is more work on the remaining workers ".  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 The patient spoke about looking for a Summer program for her son to be active over Summer. The patient spoke about working to walk more, drink more water, and has re-started going to the gym and wants to gain 10lbs and has been taking a smoothy product called serious mass.  The patient spoke about upcoming appointment with her PCP for a physical. The patient is currently taking OTC allergy medication. The OPT therapist placed emphasis on regulating sleep cycle. The patients spoke about trying to get back into dating.The patient spoke about upcoming break in June going to the beach. The OPT therapist will continue treatment work with the patient in her next scheduled session.      Plan: Return again in 2 weeks.   Diagnosis:      Axis I: Major depressive disorder, recurrent episode, moderate with anxious distress  Axis II: No diagnosis   Collaboration of Care: No additional collaboration of care for this session.   Patient/Guardian was advised Release of Information must be obtained prior to any record release in order to collaborate their care with an outside provider. Patient/Guardian was advised if they have not already done so to contact the  registration department to sign all necessary forms in order for us  to release information regarding their care.    Consent: Patient/Guardian gives verbal consent for treatment and assignment of benefits for services provided during this visit. Patient/Guardian expressed understanding and agreed to proceed.      I discussed the assessment and treatment plan with the patient. The patient was provided an opportunity to ask questions and all were answered. The patient agreed with the plan and demonstrated an understanding of the instructions.   The patient was advised to call back or seek an in-person evaluation if the symptoms worsen or if the condition fails to improve as anticipated.   I provided 30 minutes of non-face-to-face time during this encounter.   Lea Primmer, LCSW   09/24/2023

## 2023-10-15 ENCOUNTER — Telehealth: Admitting: Family Medicine

## 2023-10-15 DIAGNOSIS — J301 Allergic rhinitis due to pollen: Secondary | ICD-10-CM | POA: Diagnosis not present

## 2023-10-15 MED ORDER — PREDNISONE 20 MG PO TABS: 20.0000 mg | ORAL_TABLET | Freq: Two times a day (BID) | ORAL | 0 refills | Status: AC

## 2023-10-15 NOTE — Progress Notes (Signed)
 E visit for Allergic Rhinitis We are sorry that you are not feeling well.  Here is how we plan to help!  Based on what you have shared with me it looks like you have Allergic Rhinitis.  Rhinitis is when a reaction occurs that causes nasal congestion, runny nose, sneezing, and itching.  Most types of rhinitis are caused by an inflammation and are associated with symptoms in the eyes ears or throat. There are several types of rhinitis.  The most common are acute rhinitis, which is usually caused by a viral illness, allergic or seasonal rhinitis, and nonallergic or year-round rhinitis.  Nasal allergies occur certain times of the year.  Allergic rhinitis is caused when allergens in the air trigger the release of histamine in the body.  Histamine causes itching, swelling, and fluid to build up in the fragile linings of the nasal passages, sinuses and eyelids.  An itchy nose and clear discharge are common.  I recommend the following over the counter treatments: You should take a daily dose of antihistamine  I also would recommend a nasal spray: Flonase 2 sprays into each nostril once daily  I am also sending prednisone  HOME CARE:  You can use an over-the-counter saline nasal spray as needed Avoid areas where there is heavy dust, mites, or molds Stay indoors on windy days during the pollen season Keep windows closed in home, at least in bedroom; use air conditioner. Use high-efficiency house air filter Keep windows closed in car, turn AC on re-circulate Avoid playing out with dog during pollen season  GET HELP RIGHT AWAY IF:  If your symptoms do not improve within 10 days You become short of breath You develop yellow or green discharge from your nose for over 3 days You have coughing fits  MAKE SURE YOU:  Understand these instructions Will watch your condition Will get help right away if you are not doing well or get worse  Thank you for choosing an e-visit. Your e-visit answers were  reviewed by a board certified advanced clinical practitioner to complete your personal care plan. Depending upon the condition, your plan could have included both over the counter or prescription medications. Please review your pharmacy choice. Be sure that the pharmacy you have chosen is open so that you can pick up your prescription now.  If there is a problem you may message your provider in MyChart to have the prescription routed to another pharmacy. Your safety is important to [place]. If you have drug allergies check your prescription carefully.  For the next 24 hours, you can use MyChart to ask questions about today's visit, request a non-urgent call back, or ask for a work or school excuse from your e-visit provider. You will get an email in the next two days asking about your experience. I hope that your e-visit has been valuable and will speed your recovery.   have provided 5 minutes of non face to face time during this encounter for chart review and documentation.

## 2023-10-22 ENCOUNTER — Ambulatory Visit (HOSPITAL_COMMUNITY): Admitting: Clinical

## 2023-10-22 DIAGNOSIS — F331 Major depressive disorder, recurrent, moderate: Secondary | ICD-10-CM | POA: Diagnosis not present

## 2023-10-22 NOTE — Progress Notes (Signed)
 Virtual Visit via Video Note   I connected with Sonia Gibson on 10/22/23 at  8:00 AM EST by a video enabled telemedicine application and verified that I am speaking with the correct person using two identifiers.   Location: Patient: Home Provider: Office   I discussed the limitations of evaluation and management by telemedicine and the availability of in person appointments. The patient expressed understanding and agreed to proceed.     THERAPIST PROGRESS NOTE   Session Time: 8:00 AM-8:30 AM   Participation Level: Active   Behavioral Response: CasualAlertDepressed/Anxious   Type of Therapy: Individual Therapy   Treatment Goals addressed: Coping to assist with Depression and Anxiety   Interventions: CBT, DBT, Solution Focused, Strength-based and Supportive   Summary: Sonia Gibson is a 33 y.o. female who presents with Depression and Anxiety. The OPT therapist worked with the patient for her OPT treatment. The OPT therapist utilized Motivational Interviewing to assist in creating therapeutic repore. The patient in the session was engaged and work in collaboration with the OPT therapist overviewing recent loss of her cousin and will be involved in funeral services over the upcoming weekend.The OPT therapist utilized Cognitive Behavioral Therapy through cognitive restructuring as well as worked with the patient on grief counseling. 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   Suicidal/Homicidal: Nowithout intent/plan   Therapist Response: The OPT therapist worked with the patient for the patients scheduled session. The patient was engaged in her session and gave feedback in relation to triggers, symptoms, and behavior responses over the past few weeks The patient  spoke about the recent loss  of her cousin and the impact this has had over the past several days noting she will be involved in funeral services over the upcoming weekend. The patient spoke about spending time with her family members during this time of loss.The OPT therapist worked with the patient utilizing an in session Cognitive Behavioral Therapy exercise and provided grief counsel throughout the session. The patient was responsive in the session and verbalized, " I realize I do not need to sit in the dark and be alone I have been trying to stay busy and be around family ".  The patient spoke about working to find a out if her job will allow for bereavement. .The patient spoke about her focus on not neglecting her own self care. The OPT therapist will continue treatment work with the patient in her next scheduled session.      Plan: Return again in 2 weeks.   Diagnosis:      Axis I: Major depressive disorder, recurrent episode, moderate with anxious distress                            Axis II: No diagnosis   Collaboration of Care: No additional collaboration of care for this session.   Patient/Guardian was advised Release of Information must be obtained prior to any record release in order to collaborate their care with an outside provider. Patient/Guardian was advised if they have not already done so to contact the registration department to sign all necessary forms in order for us  to release information regarding their care.    Consent: Patient/Guardian gives verbal consent for treatment and assignment of benefits for services provided during this visit.  Patient/Guardian expressed understanding and agreed to proceed.      I discussed the assessment and treatment plan with the patient. The patient was provided an opportunity to ask questions and all were answered. The patient agreed with the plan and demonstrated an understanding of the instructions.   The patient was advised to call back or seek an in-person evaluation  if the symptoms worsen or if the condition fails to improve as anticipated.   I provided 30 minutes of non-face-to-face time during this encounter.   Lea Primmer, LCSW   10/22/2023

## 2023-11-09 ENCOUNTER — Ambulatory Visit (HOSPITAL_COMMUNITY): Admitting: Clinical

## 2023-11-09 ENCOUNTER — Encounter (HOSPITAL_COMMUNITY): Payer: Self-pay

## 2024-01-09 ENCOUNTER — Telehealth: Admitting: Family

## 2024-01-09 DIAGNOSIS — J301 Allergic rhinitis due to pollen: Secondary | ICD-10-CM | POA: Diagnosis not present

## 2024-01-09 MED ORDER — LEVOCETIRIZINE DIHYDROCHLORIDE 5 MG PO TABS: 5.0000 mg | ORAL_TABLET | Freq: Every evening | ORAL | 1 refills | Status: AC

## 2024-01-09 MED ORDER — FLUTICASONE PROPIONATE 50 MCG/ACT NA SUSP: 2.0000 | Freq: Every day | NASAL | 6 refills | Status: AC

## 2024-01-09 NOTE — Progress Notes (Signed)
E visit for Allergic Rhinitis We are sorry that you are not feeling well.  Here is how we plan to help!  Based on what you have shared with me it looks like you have Allergic Rhinitis.  Rhinitis is when a reaction occurs that causes nasal congestion, runny nose, sneezing, and itching.  Most types of rhinitis are caused by an inflammation and are associated with symptoms in the eyes ears or throat. There are several types of rhinitis.  The most common are acute rhinitis, which is usually caused by a viral illness, allergic or seasonal rhinitis, and nonallergic or year-round rhinitis.  Nasal allergies occur certain times of the year.  Allergic rhinitis is caused when allergens in the air trigger the release of histamine in the body.  Histamine causes itching, swelling, and fluid to build up in the fragile linings of the nasal passages, sinuses and eyelids.  An itchy nose and clear discharge are common.  I recommend the following over the counter treatments: Xyzal 5 mg take 1 tablet daily  I also would recommend a nasal spray: Flonase 2 sprays into each nostril once daily   HOME CARE:   You can use an over-the-counter saline nasal spray as needed  Avoid areas where there is heavy dust, mites, or molds  Stay indoors on windy days during the pollen season  Keep windows closed in home, at least in bedroom; use air conditioner.  Use high-efficiency house air filter  Keep windows closed in car, turn AC on re-circulate  Avoid playing out with dog during pollen season  GET HELP RIGHT AWAY IF:   If your symptoms do not improve within 10 days  You become short of breath  You develop yellow or green discharge from your nose for over 3 days  You have coughing fits  MAKE SURE YOU:   Understand these instructions  Will watch your condition  Will get help right away if you are not doing well or get worse  Thank you for choosing an e-visit. Your e-visit answers were reviewed by a  board certified advanced clinical practitioner to complete your personal care plan. Depending upon the condition, your plan could have included both over the counter or prescription medications. Please review your pharmacy choice. Be sure that the pharmacy you have chosen is open so that you can pick up your prescription now.  If there is a problem you may message your provider in MyChart to have the prescription routed to another pharmacy. Your safety is important to us. If you have drug allergies check your prescription carefully.  For the next 24 hours, you can use MyChart to ask questions about today's visit, request a non-urgent call back, or ask for a work or school excuse from your e-visit provider. You will get an email in the next two days asking about your experience. I hope that your e-visit has been valuable and will speed your recovery.   Approximately 5 minutes was spent documenting and reviewing patient's chart.        

## 2024-02-14 ENCOUNTER — Other Ambulatory Visit (HOSPITAL_BASED_OUTPATIENT_CLINIC_OR_DEPARTMENT_OTHER): Payer: Self-pay

## 2024-02-14 MED ORDER — VALACYCLOVIR HCL 1 G PO TABS
1000.0000 mg | ORAL_TABLET | Freq: Every day | ORAL | 0 refills | Status: AC
  Filled 2024-02-14: qty 30, 30d supply, fill #0
  Filled 2024-06-20: qty 30, 30d supply, fill #1

## 2024-04-01 ENCOUNTER — Telehealth: Admitting: Physician Assistant

## 2024-04-01 DIAGNOSIS — J019 Acute sinusitis, unspecified: Secondary | ICD-10-CM

## 2024-04-01 DIAGNOSIS — B9789 Other viral agents as the cause of diseases classified elsewhere: Secondary | ICD-10-CM | POA: Diagnosis not present

## 2024-04-01 NOTE — Progress Notes (Signed)
 We are sorry that you are not feeling well.  Here is how we plan to help!  Based on what you have shared with me it looks like you have sinusitis.  Sinusitis is inflammation and infection in the sinus cavities of the head.  Based on your presentation I believe you most likely have Acute Viral Sinusitis.This is an infection most likely caused by a virus. There is not specific treatment for viral sinusitis other than to help you with the symptoms until the infection runs its course.  You may use an oral decongestant such as Mucinex D or if you have glaucoma or high blood pressure use plain Mucinex. Saline nasal spray help and can safely be used as often as needed for congestion, I recommend Mucinex and Flonase  as well.   Some authorities believe that zinc sprays or the use of Echinacea may shorten the course of your symptoms.  Sinus infections are not as easily transmitted as other respiratory infection, however we still recommend that you avoid close contact with loved ones, especially the very young and elderly.  Remember to wash your hands thoroughly throughout the day as this is the number one way to prevent the spread of infection!  Home Care: Only take medications as instructed by your medical team. Do not take these medications with alcohol. A steam or ultrasonic humidifier can help congestion.  You can place a towel over your head and breathe in the steam from hot water coming from a faucet. Avoid close contacts especially the very young and the elderly. Cover your mouth when you cough or sneeze. Always remember to wash your hands.  Get Help Right Away If: You develop worsening fever or sinus pain. You develop a severe head ache or visual changes. Your symptoms persist after you have completed your treatment plan.  Make sure you Understand these instructions. Will watch your condition. Will get help right away if you are not doing well or get worse.  Your e-visit answers were reviewed  by a board certified advanced clinical practitioner to complete your personal care plan.  Depending on the condition, your plan could have included both over the counter or prescription medications.  If there is a problem please reply  once you have received a response from your provider.  Your safety is important to us .  If you have drug allergies check your prescription carefully.    You can use MyChart to ask questions about today's visit, request a non-urgent call back, or ask for a work or school excuse for 24 hours related to this e-Visit. If it has been greater than 24 hours you will need to follow up with your provider, or enter a new e-Visit to address those concerns.  You will get an e-mail in the next two days asking about your experience.  I hope that your e-visit has been valuable and will speed your recovery. Thank you for using e-visits.  I have spent 5 minutes in review of e-visit questionnaire, review and updating patient chart, medical decision making and response to patient.   Harlene PEDLAR Ward, PA-C

## 2024-05-14 ENCOUNTER — Telehealth: Admitting: Physician Assistant

## 2024-05-14 DIAGNOSIS — N760 Acute vaginitis: Secondary | ICD-10-CM

## 2024-05-14 DIAGNOSIS — B9689 Other specified bacterial agents as the cause of diseases classified elsewhere: Secondary | ICD-10-CM | POA: Diagnosis not present

## 2024-05-14 MED ORDER — METRONIDAZOLE 0.75 % VA GEL: 1.0000 | Freq: Every day | VAGINAL | 0 refills | Status: AC

## 2024-05-14 NOTE — Progress Notes (Signed)
 We are sorry that you are not feeling well. Here is how we plan to help! Based on what you shared with me it looks like you: May have a vaginosis due to bacteria  Vaginosis is an inflammation of the vagina that can result in discharge, itching and pain. The cause is usually a change in the normal balance of vaginal bacteria or an infection. Vaginosis can also result from reduced estrogen levels after menopause.  The most common causes of vaginosis are:   Bacterial vaginosis which results from an overgrowth of one on several organisms that are normally present in your vagina.   Yeast infections which are caused by a naturally occurring fungus called candida.   Vaginal atrophy (atrophic vaginosis) which results from the thinning of the vagina from reduced estrogen levels after menopause.   Trichomoniasis which is caused by a parasite and is commonly transmitted by sexual intercourse.  Factors that increase your risk of developing vaginosis include: Medications, such as antibiotics and steroids Uncontrolled diabetes Use of hygiene products such as bubble bath, vaginal spray or vaginal deodorant Douching Wearing damp or tight-fitting clothing Using an intrauterine device (IUD) for birth control Hormonal changes, such as those associated with pregnancy, birth control pills or menopause Sexual activity Having a sexually transmitted infection  Your treatment plan is Metronidazole  vaginal gel Place one applicator tip full at bedtime for 7 days.  I have electronically sent this prescription into the pharmacy that you have chosen.  Be sure to take all of the medication as directed. Stop taking any medication if you develop a rash, tongue swelling or shortness of breath. Mothers who are breast feeding should consider pumping and discarding their breast milk while on these antibiotics. However, there is no consensus that infant exposure at these doses would be harmful.  Remember that medication  creams can weaken latex condoms.   HOME CARE:  Good hygiene may prevent some types of vaginosis from recurring and may relieve some symptoms:  Avoid baths, hot tubs and whirlpool spas. Rinse soap from your outer genital area after a shower, and dry the area well to prevent irritation. Don't use scented or harsh soaps, such as those with deodorant or antibacterial action. Avoid irritants. These include scented tampons and pads. Wipe from front to back after using the toilet. Doing so avoids spreading fecal bacteria to your vagina.  Other things that may help prevent vaginosis include:  Don't douche. Your vagina doesn't require cleansing other than normal bathing. Repetitive douching disrupts the normal organisms that reside in the vagina and can actually increase your risk of vaginal infection. Douching won't clear up a vaginal infection. Use a latex condom. Both female and female latex condoms may help you avoid infections spread by sexual contact. Wear cotton underwear. Also wear pantyhose with a cotton crotch. If you feel comfortable without it, skip wearing underwear to bed. Yeast thrives in [place] Your symptoms should improve in the next day or two.  GET HELP RIGHT AWAY IF:  You have pain in your lower abdomen ( pelvic area or over your ovaries) You develop nausea or vomiting You develop a fever Your discharge changes or worsens You have persistent pain with intercourse You develop shortness of breath, a rapid pulse, or you faint.  These symptoms could be signs of problems or infections that need to be evaluated by a medical provider now.  MAKE SURE YOU   Understand these instructions. Will watch your condition. Will get help right away if you  are not doing well or get worse.  Your e-visit answers were reviewed by a board certified advanced clinical practitioner to complete your personal care plan. Depending upon the condition, your plan could have included both over  the counter or prescription medications. Please review your pharmacy choice to make sure that you have choses a pharmacy that is open for you to pick up any needed prescription, Your safety is important to us . If you have drug allergies check your prescription carefully.   You can use MyChart to ask questions about todays visit, request a non-urgent call back, or ask for a work or school excuse for 24 hours related to this e-Visit. If it has been greater than 24 hours you will need to follow up with your provider, or enter a new e-Visit to address those concerns. You will get a MyChart message within the next two days asking about your experience. I hope that your e-visit has been valuable and will speed your recovery.  I have spent 5 minutes in review of e-visit questionnaire, review and updating patient chart, medical decision making and response to patient.   Delon CHRISTELLA Dickinson, PA-C

## 2024-05-15 ENCOUNTER — Telehealth

## 2024-06-19 ENCOUNTER — Telehealth: Admitting: Emergency Medicine

## 2024-06-19 DIAGNOSIS — A6000 Herpesviral infection of urogenital system, unspecified: Secondary | ICD-10-CM

## 2024-06-19 MED ORDER — ACYCLOVIR 5 % EX OINT: 1.0000 | TOPICAL_OINTMENT | CUTANEOUS | 0 refills | Status: AC

## 2024-06-20 ENCOUNTER — Other Ambulatory Visit (HOSPITAL_BASED_OUTPATIENT_CLINIC_OR_DEPARTMENT_OTHER): Payer: Self-pay
